# Patient Record
Sex: Male | Born: 1935
Health system: Southern US, Community
[De-identification: ages and names within clinical notes are randomized; demographics above are authoritative.]

## PROBLEM LIST (undated history)

## (undated) DIAGNOSIS — H409 Unspecified glaucoma: Secondary | ICD-10-CM

## (undated) DIAGNOSIS — E079 Disorder of thyroid, unspecified: Secondary | ICD-10-CM

## (undated) DIAGNOSIS — K219 Gastro-esophageal reflux disease without esophagitis: Secondary | ICD-10-CM

## (undated) HISTORY — PX: CATARACT EXTRACTION: SUR2

## (undated) HISTORY — DX: Unspecified glaucoma: H40.9

## (undated) HISTORY — DX: Gastro-esophageal reflux disease without esophagitis: K21.9

## (undated) HISTORY — DX: Disorder of thyroid, unspecified: E07.9

---

## 2002-10-06 ENCOUNTER — Encounter: Admission: RE | Admit: 2002-10-06 | Discharge: 2002-10-06 | Payer: Self-pay | Admitting: Family Medicine

## 2002-10-06 ENCOUNTER — Encounter: Payer: Self-pay | Admitting: Family Medicine

## 2004-06-29 ENCOUNTER — Encounter (INDEPENDENT_AMBULATORY_CARE_PROVIDER_SITE_OTHER): Payer: Self-pay | Admitting: Specialist

## 2004-06-29 ENCOUNTER — Ambulatory Visit (HOSPITAL_COMMUNITY): Admission: RE | Admit: 2004-06-29 | Discharge: 2004-06-29 | Payer: Self-pay | Admitting: *Deleted

## 2006-01-10 ENCOUNTER — Ambulatory Visit: Payer: Self-pay | Admitting: Family Medicine

## 2006-07-26 ENCOUNTER — Ambulatory Visit: Payer: Self-pay | Admitting: Family Medicine

## 2007-02-05 ENCOUNTER — Ambulatory Visit: Payer: Self-pay | Admitting: Family Medicine

## 2007-02-05 DIAGNOSIS — R1013 Epigastric pain: Secondary | ICD-10-CM | POA: Insufficient documentation

## 2007-02-06 ENCOUNTER — Encounter (INDEPENDENT_AMBULATORY_CARE_PROVIDER_SITE_OTHER): Payer: Self-pay | Admitting: Family Medicine

## 2007-02-06 ENCOUNTER — Encounter (INDEPENDENT_AMBULATORY_CARE_PROVIDER_SITE_OTHER): Payer: Self-pay | Admitting: *Deleted

## 2007-02-06 LAB — CONVERTED CEMR LAB
ALT: 27 units/L (ref 0–53)
Albumin: 4.3 g/dL (ref 3.5–5.2)
Alkaline Phosphatase: 53 units/L (ref 39–117)
Basophils Absolute: 0 10*3/uL (ref 0.0–0.1)
HCT: 50.7 % (ref 39.0–52.0)
Hemoglobin: 17.1 g/dL — ABNORMAL HIGH (ref 13.0–17.0)
MCHC: 33.7 g/dL (ref 30.0–36.0)
Monocytes Relative: 10.2 % (ref 3.0–11.0)
Neutro Abs: 4.5 10*3/uL (ref 1.4–7.7)
Total Bilirubin: 1.3 mg/dL — ABNORMAL HIGH (ref 0.3–1.2)
Total Protein: 7.5 g/dL (ref 6.0–8.3)
WBC: 7.1 10*3/uL (ref 4.5–10.5)

## 2007-02-17 DIAGNOSIS — K7689 Other specified diseases of liver: Secondary | ICD-10-CM | POA: Insufficient documentation

## 2007-10-20 ENCOUNTER — Telehealth (INDEPENDENT_AMBULATORY_CARE_PROVIDER_SITE_OTHER): Payer: Self-pay | Admitting: *Deleted

## 2008-01-19 ENCOUNTER — Telehealth (INDEPENDENT_AMBULATORY_CARE_PROVIDER_SITE_OTHER): Payer: Self-pay | Admitting: *Deleted

## 2017-09-02 ENCOUNTER — Encounter: Payer: Self-pay | Admitting: Family Medicine

## 2017-09-02 ENCOUNTER — Ambulatory Visit: Payer: Medicare Other | Admitting: Family Medicine

## 2017-09-02 VITALS — BP 128/80 | HR 74 | Ht 69.7 in | Wt 222.5 lb

## 2017-09-02 DIAGNOSIS — G4700 Insomnia, unspecified: Secondary | ICD-10-CM

## 2017-09-02 DIAGNOSIS — K219 Gastro-esophageal reflux disease without esophagitis: Secondary | ICD-10-CM | POA: Insufficient documentation

## 2017-09-02 DIAGNOSIS — N183 Chronic kidney disease, stage 3 unspecified: Secondary | ICD-10-CM | POA: Insufficient documentation

## 2017-09-02 DIAGNOSIS — E039 Hypothyroidism, unspecified: Secondary | ICD-10-CM

## 2017-09-02 LAB — CBC
HCT: 44.6 % (ref 39.0–52.0)
Hemoglobin: 15.3 g/dL (ref 13.0–17.0)
MCHC: 34.4 g/dL (ref 30.0–36.0)
MCV: 97.5 fl (ref 78.0–100.0)
PLATELETS: 159 10*3/uL (ref 150.0–400.0)
RBC: 4.57 Mil/uL (ref 4.22–5.81)
RDW: 13.2 % (ref 11.5–15.5)
WBC: 6.1 10*3/uL (ref 4.0–10.5)

## 2017-09-02 LAB — BASIC METABOLIC PANEL
BUN: 18 mg/dL (ref 6–23)
CALCIUM: 9.2 mg/dL (ref 8.4–10.5)
CO2: 27 mEq/L (ref 19–32)
CREATININE: 1.08 mg/dL (ref 0.40–1.50)
Chloride: 104 mEq/L (ref 96–112)
GFR: 68.57 mL/min (ref >60.00)
Glucose, Bld: 93 mg/dL (ref 70–99)
Potassium: 4.4 mEq/L (ref 3.5–5.1)
SODIUM: 140 meq/L (ref 135–145)

## 2017-09-02 LAB — TSH: TSH: 2.75 u[IU]/mL (ref 0.35–4.50)

## 2017-09-02 MED ORDER — MELATONIN 5 MG PO CAPS: ORAL_CAPSULE | ORAL | 0 refills | Status: DC

## 2017-09-02 MED ORDER — OMEPRAZOLE 20 MG PO CPDR: 20.0000 mg | DELAYED_RELEASE_CAPSULE | Freq: Every day | ORAL | 4 refills | Status: DC

## 2017-09-02 NOTE — Progress Notes (Addendum)
Subjective:  Patient ID: Todd Mckay, male    DOB: 1927-08-02  Age: 84 y.o. MRN: [id number]  CC: Establish Care   HPI Todd Mckay presents for establishment of care and follow-up of his hypothyroidism, GERD, CKD and insomnia.  Last TSH approximately 1 year ago was normal.  He is not having no problems taking his levothyroxine.  He is taking it in the morning 1 hour prior to eating.  He has been taking Prilosec as needed for reflux symptoms.  He takes it 4-5 times a week his symptoms are well controlled with that.  He has a history of CKD stage III.  It is been stable.  He has no history of hypertension or chronic kidney disease.  He has not had issues with anemia in the past.  He has difficulty falling asleep at times and is taken alprazolam in the distant past.  He has not taken this drug in quite some time.  He denies depression.  He has not tried melatonin but is open to trying that medicine.  He remains active around his house and plays golf.  He lives with his wife.  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  His wife and he lives independently.  They are able to perform all of their activities of daily living.  With his history of glaucoma he sees the eye doctor regularly.  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  History Todd Mckay has a past medical history of GERD (gastroesophageal reflux disease), Glaucoma, and Thyroid disease.   He has a past surgical history that includes Cataract extraction (Right) and Cataract extraction (Left).   His family history is not on file.He reports that he has quit smoking. He has never used smokeless tobacco. His alcohol and drug histories are not on file.  Outpatient Medications Prior to Visit  Medication Sig Dispense Refill  . donepezil (ARICEPT) 10 MG tablet Take 1 tablet by mouth daily.  3  . dorzolamide-timolol (COSOPT) 22.3-6.8 MG/ML ophthalmic solution  Instill 1 drop in both eyes twice daily.  2  . latanoprost (XALATAN) 0.005 % ophthalmic solution INT 1 GTT IN OD Q NIGHT  1  . pilocarpine (PILOCAR) 1 % ophthalmic solution INT 1 GTT IN OD TID  11  . ALPRAZolam (XANAX) 0.25 MG tablet Take 0.25 mg by mouth at bedtime as needed for anxiety.    Marland Kitchen levothyroxine (SYNTHROID, LEVOTHROID) 75 MCG tablet Take 1 tablet by mouth daily.  3  . omeprazole (PRILOSEC) 20 MG capsule Take 20 mg by mouth daily.     No facility-administered medications prior to visit.     ROS Review of Systems  Constitutional: Negative.   HENT: Negative.   Eyes: Negative.   Respiratory: Negative.   Cardiovascular: Negative.   Gastrointestinal: Negative.   Endocrine: Negative.  Negative for polyphagia and polyuria.  Genitourinary: Negative.  Negative for difficulty urinating, hematuria and urgency.  Musculoskeletal: Negative for gait problem and myalgias.  Skin: Negative.   Allergic/Immunologic: Negative for immunocompromised state.  Neurological: Negative for weakness and headaches.  Hematological: Does not bruise/bleed easily.  Psychiatric/Behavioral: Positive for self-injury. Negative for behavioral problems and dysphoric mood.    Objective:  BP 128/80   Pulse 74   Ht 5' 9.7" (1.77 m)   Wt 222 lb 8 oz (100.9 kg)   SpO2 99%   BMI 32.20 kg/m   Physical Exam  Constitutional: He  is oriented to person, place, and time. He appears well-developed and well-nourished. No distress.  HENT:  Head: Normocephalic and atraumatic.  Right Ear: External ear normal.  Left Ear: External ear normal.  Nose: Nose normal.  Mouth/Throat: Oropharynx is clear and moist. No oropharyngeal exudate.  Eyes: Pupils are equal, round, and reactive to light. Conjunctivae and EOM are normal. Right eye exhibits no discharge. Left eye exhibits no discharge.  Neck: Normal range of motion. Neck supple. No JVD present. No tracheal deviation present. No thyromegaly present.  Cardiovascular: Normal  rate, regular rhythm and normal heart sounds.  Pulmonary/Chest: Effort normal and breath sounds normal. No stridor. No respiratory distress. He has no wheezes. He has no rales.  Abdominal: Bowel sounds are normal.  Musculoskeletal: He exhibits no edema or deformity.  Lymphadenopathy:    He has no cervical adenopathy.  Neurological: He is alert and oriented to person, place, and time.  Skin: Skin is warm and dry. Capillary refill takes less than 2 seconds. No rash noted. He is not diaphoretic. No erythema. No pallor.  Psychiatric: He has a normal mood and affect. His behavior is normal.      Assessment & Plan:   Todd Mckay was seen today for establish care.  Diagnoses and all orders for this visit:  Hypothyroidism, unspecified type -     TSH -     levothyroxine (SYNTHROID, LEVOTHROID) 75 MCG tablet; Take 1 tablet (75 mcg total) by mouth daily.  Gastroesophageal reflux disease, esophagitis presence not specified -     omeprazole (PRILOSEC) 20 MG capsule; Take 1 capsule (20 mg total) by mouth daily.  CKD (chronic kidney disease) stage 3, GFR 30-59 ml/min (HCC) -     Basic metabolic panel -     CBC  Insomnia, unspecified type -     Melatonin 5 MG CAPS; Take one at night as needed for sleep.   I have discontinued Todd Mckay's ALPRAZolam. I have also changed his omeprazole and levothyroxine. Additionally, I am having him start on Melatonin. Lastly, I am having him maintain his donepezil, dorzolamide-timolol, latanoprost, and pilocarpine.  Meds ordered this encounter  Medications  . Melatonin 5 MG CAPS    Sig: Take one at night as needed for sleep.    Dispense:  90 each    Refill:  0  . omeprazole (PRILOSEC) 20 MG capsule    Sig: Take 1 capsule (20 mg total) by mouth daily.    Dispense:  90 capsule    Refill:  4  . levothyroxine (SYNTHROID, LEVOTHROID) 75 MCG tablet    Sig: Take 1 tablet (75 mcg total) by mouth daily.    Dispense:  100 tablet    Refill:  4   Sleep hygiene was  discussed.  Encouraged him to stay active.  He will give the melatonin to try and let me know how it works out for him.  He is doing well and will plan on following him up in 6 months he is to see me sooner with any problems at all.  Follow-up: Return in about 6 months (around 03/05/2018), or if symptoms worsen or fail to improve.  Mliss Sax, MD

## 2017-09-02 NOTE — Patient Instructions (Signed)

## 2017-09-03 MED ORDER — LEVOTHYROXINE SODIUM 75 MCG PO TABS: 75.0000 ug | ORAL_TABLET | Freq: Every day | ORAL | 4 refills | Status: DC

## 2017-09-03 NOTE — Addendum Note (Signed)
Addended by: Andrez Grime on: 09/03/2017 01:42 PM   Modules accepted: Orders

## 2017-09-11 ENCOUNTER — Ambulatory Visit: Payer: Medicare Other | Admitting: Family Medicine

## 2017-09-11 ENCOUNTER — Encounter: Payer: Self-pay | Admitting: Family Medicine

## 2017-09-11 ENCOUNTER — Ambulatory Visit (INDEPENDENT_AMBULATORY_CARE_PROVIDER_SITE_OTHER): Payer: Medicare Other

## 2017-09-11 VITALS — BP 128/72 | HR 84 | Temp 98.0°F | Ht 69.7 in | Wt 220.0 lb

## 2017-09-11 DIAGNOSIS — M1710 Unilateral primary osteoarthritis, unspecified knee: Secondary | ICD-10-CM | POA: Insufficient documentation

## 2017-09-11 DIAGNOSIS — M25561 Pain in right knee: Secondary | ICD-10-CM | POA: Diagnosis not present

## 2017-09-11 DIAGNOSIS — M179 Osteoarthritis of knee, unspecified: Secondary | ICD-10-CM | POA: Insufficient documentation

## 2017-09-11 NOTE — Patient Instructions (Signed)
Nice to meet you.   Please try to ice your knee.  Take tylenol 650 mg three times a day is the best evidence based medicine we have for arthritis.   Glucosamine sulfate  twice a day is a supplement that has been shown to help moderate to severe arthritis.  Vitamin D 2000 IU daily  Fish oil 2 grams daily.   Tumeric  twice daily.   Capsaicin topically up to four times a day may also help with pain.  Cortisone injections are an option if these interventions do not seem to make a difference or need more relief.   If cortisone injections do not help, there are different types of shots that may help but they take longer to take effect.  We can discuss this at follow up.   It's important that you continue to stay active.  Controlling your weight is important.   Please follow up with me in 4-6 weeks if there is no improvement.

## 2017-09-11 NOTE — Progress Notes (Signed)
Todd Mckay - 84 y.o. male MRN [id number]  Date of birth: November 10, 1927  SUBJECTIVE:  Including CC & ROS.  Chief Complaint  Patient presents with  . Knee Pain    Todd Mckay is a 84 y.o. male that is presenting with knee pain. Pain is bilateral, right worse than left. Ongoing for one month. Located in the medial aspect of his right knee. Pain is described as an ache. He has been taking Advil for the pain. Admits to swelling. Denies tenderness. Denies activity exacerbates the pain. He states the pain has interfered with him playing golf weekly.   Review of right knee x-ray from 02/29/2016 shows narrowing of the medial joint compartment.   Review of Systems  Constitutional: Negative for fever.  HENT: Negative for congestion.   Respiratory: Negative for cough.   Cardiovascular: Negative for chest pain.  Gastrointestinal: Negative for abdominal distention.  Musculoskeletal: Negative for gait problem.  Skin: Negative for color change.  Neurological: Negative for weakness.  Hematological: Negative for adenopathy.  Psychiatric/Behavioral: Negative for agitation.    HISTORY: Past Medical, Surgical, Social, and Family History Reviewed & Updated per EMR.   Pertinent Historical Findings include:  Past Medical History:  Diagnosis Date  . GERD (gastroesophageal reflux disease)   . Glaucoma   . Thyroid disease     Past Surgical History:  Procedure Laterality Date  . CATARACT EXTRACTION Right   . CATARACT EXTRACTION Left     Allergies  Allergen Reactions  . Erythromycin   . Erythromycin Base Hives    Derm. Hives  . Penicillins Hives    Derm, Hives     No family history on file.   Social History   Socioeconomic History  . Marital status: Married    Spouse name: Not on file  . Number of children: Not on file  . Years of education: Not on file  . Highest education level: Not on file  Occupational History  . Not on file  Social Needs  . Financial resource strain: Not on  file  . Food insecurity:    Worry: Not on file    Inability: Not on file  . Transportation needs:    Medical: Not on file    Non-medical: Not on file  Tobacco Use  . Smoking status: Former Games developer  . Smokeless tobacco: Never Used  Substance and Sexual Activity  . Alcohol use: Not on file  . Drug use: Not on file  . Sexual activity: Not on file  Lifestyle  . Physical activity:    Days per week: Not on file    Minutes per session: Not on file  . Stress: Not on file  Relationships  . Social connections:    Talks on phone: Not on file    Gets together: Not on file    Attends religious service: Not on file    Active member of club or organization: Not on file    Attends meetings of clubs or organizations: Not on file    Relationship status: Not on file  . Intimate partner violence:    Fear of current or ex partner: Not on file    Emotionally abused: Not on file    Physically abused: Not on file    Forced sexual activity: Not on file  Other Topics Concern  . Not on file  Social History Narrative  . Not on file     PHYSICAL EXAM:  VS: BP 128/72 (BP Location: Left Arm, Patient Position: Sitting, Cuff Size:  Normal)   Pulse 84   Temp 98 F (36.7 C) (Oral)   Ht 5' 9.7" (1.77 m)   Wt 220 lb (99.8 kg)   SpO2 98%   BMI 31.84 kg/m  Physical Exam Gen: NAD, alert, cooperative with exam, well-appearing ENT: normal lips, normal nasal mucosa,  Eye: normal EOM, normal conjunctiva and lids CV:  no edema, +2 pedal pulses   Resp: no accessory muscle use, non-labored,   Skin: no rashes, no areas of induration  Neuro: normal tone, normal sensation to touch Psych:  normal insight, alert and oriented MSK:  Right Knee: Normal to inspection with no erythema or effusion or obvious bony abnormalities. Palpation normal with no warmth, joint line tenderness, patellar tenderness, or condyle tenderness. ROM full in flexion and extension and lower leg rotation. Ligaments with solid  consistent endpoints including  LCL, MCL. Negative Mcmurray's tests. Non painful patellar compression. Patellar glide without crepitus. Patellar and quadriceps tendons unremarkable. Hamstring and quadriceps strength is normal.  Neurovascularly intact   Limited ultrasound: Right knee:  No significant effusion in the suprapatellar pouch. Normal-appearing quadricep and patellar tendon. Normal-appearing lateral meniscus. Significant narrowing of the medial joint line with outpouching of the medial meniscus  Summary: Significant degenerative changes of the medial joint line  Ultrasound and interpretation by Clare Gandy, MD    Aspiration/Injection Procedure Note Aaren Atallah [birth year]  Procedure: Injection Indications: Right knee pain  Procedure Details Consent: Risks of procedure as well as the alternatives and risks of each were explained to the (patient/caregiver).  Consent for procedure obtained. Time Out: Verified patient identification, verified procedure, site/side was marked, verified correct patient position, special equipment/implants available, medications/allergies/relevent history reviewed, required imaging and test results available.  Performed.  The area was cleaned with iodine and alcohol swabs.    The right knee superior lateral suprapatellar pouch was injected using 1 cc's of 40 mg/mL Kenalog and 4 cc's of 0.25% bupivacaine with a 22 1 1/2" needle.  Ultrasound was used. Images were obtained in  Long views showing the injection.    A sterile dressing was applied.  Patient did tolerate procedure well.   ASSESSMENT & PLAN:   Acute pain of right knee Acute on chronic in nature.  Likely the result of arthritic change.  No mechanical symptoms today. -Injection today -Counseled on supportive care -Counseled on home exercise therapy -X-ray -If no improvement can consider physical therapy or Visco supplementation.

## 2017-09-11 NOTE — Assessment & Plan Note (Signed)
Acute on chronic in nature.  Likely the result of arthritic change.  No mechanical symptoms today. -Injection today -Counseled on supportive care -Counseled on home exercise therapy -X-ray -If no improvement can consider physical therapy or Visco supplementation.

## 2017-12-23 ENCOUNTER — Encounter: Payer: Self-pay | Admitting: Family Medicine

## 2017-12-23 ENCOUNTER — Ambulatory Visit: Payer: Medicare Other | Admitting: Family Medicine

## 2017-12-23 ENCOUNTER — Ambulatory Visit (INDEPENDENT_AMBULATORY_CARE_PROVIDER_SITE_OTHER): Payer: Medicare Other

## 2017-12-23 VITALS — BP 128/62 | HR 58 | Ht 69.7 in | Wt 226.0 lb

## 2017-12-23 DIAGNOSIS — M1711 Unilateral primary osteoarthritis, right knee: Secondary | ICD-10-CM

## 2017-12-23 NOTE — Progress Notes (Signed)
Todd Mckay - 84 y.o. male MRN [id number]  Date of birth: [birth year]  SUBJECTIVE:  Including CC & ROS. He has Chief Complaint  Patient presents with  . Follow-up    Todd Mckay is a 84 y.o. male that is here today for right knee follow up. He was seen on 09/11/17 was given an steroid injection which improved his pain. He states his knee stills ache-worse when he is sitting. Pain is located at the lateral portion. He has been taking tylenol for the pain. Denies injury.  The steroid injection improve the pain for roughly 2 to 3 weeks.  He has never had gel injections previously.  The pain feels similar to what has previously.  Denies any injury or inciting event.  Pain is occurring on the medial aspect of the right knee.  Independent review of the right knee x-ray from 5/15 shows degenerative joint disease the medial component of moderate severity.   Review of Systems  Constitutional: Negative for fever.  HENT: Negative for congestion.   Respiratory: Negative for cough.   Cardiovascular: Negative for chest pain.  Gastrointestinal: Negative for abdominal pain.  Musculoskeletal: Positive for arthralgias.  Skin: Negative for color change.  Neurological: Negative for weakness.  Hematological: Negative for adenopathy.  Psychiatric/Behavioral: Negative for agitation.    HISTORY: Past Medical, Surgical, Social, and Family History Reviewed & Updated per EMR.   Pertinent Historical Findings include:  Past Medical History:  Diagnosis Date  . GERD (gastroesophageal reflux disease)   . Glaucoma   . Thyroid disease     Past Surgical History:  Procedure Laterality Date  . CATARACT EXTRACTION Right   . CATARACT EXTRACTION Left     Allergies  Allergen Reactions  . Acyclovir And Related   . Erythromycin   . Erythromycin Base Hives    Derm. Hives  . Penicillins Hives    Derm, Hives     No family history on file.   Social History   Socioeconomic History  . Marital status: Married     Spouse name: Not on file  . Number of children: Not on file  . Years of education: Not on file  . Highest education level: Not on file  Occupational History  . Not on file  Social Needs  . Financial resource strain: Not on file  . Food insecurity:    Worry: Not on file    Inability: Not on file  . Transportation needs:    Medical: Not on file    Non-medical: Not on file  Tobacco Use  . Smoking status: Former Games developer  . Smokeless tobacco: Never Used  Substance and Sexual Activity  . Alcohol use: Not on file  . Drug use: Not on file  . Sexual activity: Not on file  Lifestyle  . Physical activity:    Days per week: Not on file    Minutes per session: Not on file  . Stress: Not on file  Relationships  . Social connections:    Talks on phone: Not on file    Gets together: Not on file    Attends religious service: Not on file    Active member of club or organization: Not on file    Attends meetings of clubs or organizations: Not on file    Relationship status: Not on file  . Intimate partner violence:    Fear of current or ex partner: Not on file    Emotionally abused: Not on file    Physically abused: Not on  file    Forced sexual activity: Not on file  Other Topics Concern  . Not on file  Social History Narrative  . Not on file     PHYSICAL EXAM:  VS: BP 128/62 (BP Location: Left Arm, Patient Position: Sitting, Cuff Size: Normal)   Pulse (!) 58   Ht 5' 9.7" (1.77 m)   Wt 226 lb (102.5 kg)   SpO2 96%   BMI 32.71 kg/m  Physical Exam Gen: NAD, alert, cooperative with exam, well-appearing ENT: normal lips, normal nasal mucosa,  Eye: normal EOM, normal conjunctiva and lids CV:  no edema, +2 pedal pulses   Resp: no accessory muscle use, non-labored,  Skin: no rashes, no areas of induration  Neuro: normal tone, normal sensation to touch Psych:  normal insight, alert and oriented MSK:  Right knee:  No obvious effusion. Tenderness palpation of the medial joint  line. Normal strength to resistance with knee flexion and extension. Normal range of motion. Mild instability with valgus testing. Negative McMurray's test. No pain with patellar grind or compression. Neurovascular intact   Aspiration/Injection Procedure Note Juliane PootBill Abdou 11/25/1927  Procedure: Injection Indications: Right knee pain  Procedure Details Consent: Risks of procedure as well as the alternatives and risks of each were explained to the (patient/caregiver).  Consent for procedure obtained. Time Out: Verified patient identification, verified procedure, site/side was marked, verified correct patient position, special equipment/implants available, medications/allergies/relevent history reviewed, required imaging and test results available.  Performed.  The area was cleaned with iodine and alcohol swabs.    The right knee superior lateral suprapatellar pouch was injected using 1 cc's of 40 mg Kenalog and 4 cc's of 0.25% bupivacaine with a 22 1 1/2" needle.  Ultrasound was used. Images were obtained in Long views showing the injection.    A sterile dressing was applied.  Patient did tolerate procedure well.       ASSESSMENT & PLAN:   OA (osteoarthritis) of knee Pain likely OA in nature. Had limited improvement for only 2-3 weeks with prior steroid injection  - injection today  - pennsaid samples provided  - will try gel injections.

## 2017-12-23 NOTE — Patient Instructions (Signed)
Good to see you  Please try ice and compression  Take tylenol 650 mg three times a day is the best evidence based medicine we have for arthritis.  Glucosamine sulfate 750mg  twice a day is a supplement that has been shown to help moderate to severe arthritis. Vitamin D 2000 IU daily Fish oil 2 grams daily.  Tumeric 500mg  twice daily.  Capsaicin topically up to four times a day may also help with pain. We will call you about the gel injections.

## 2017-12-24 ENCOUNTER — Ambulatory Visit: Payer: Medicare Other | Admitting: Family Medicine

## 2017-12-24 ENCOUNTER — Encounter: Payer: Self-pay | Admitting: Family Medicine

## 2017-12-24 ENCOUNTER — Other Ambulatory Visit: Payer: Self-pay | Admitting: Family Medicine

## 2017-12-24 VITALS — BP 130/80 | HR 105 | Ht 69.7 in | Wt 226.0 lb

## 2017-12-24 DIAGNOSIS — R5383 Other fatigue: Secondary | ICD-10-CM | POA: Diagnosis not present

## 2017-12-24 DIAGNOSIS — G4700 Insomnia, unspecified: Secondary | ICD-10-CM | POA: Insufficient documentation

## 2017-12-24 LAB — URINALYSIS, ROUTINE W REFLEX MICROSCOPIC
Bilirubin Urine: NEGATIVE
Hgb urine dipstick: NEGATIVE
KETONES UR: NEGATIVE
Leukocytes, UA: NEGATIVE
NITRITE: NEGATIVE
PH: 5.5 (ref 5.0–8.0)
RBC / HPF: NONE SEEN (ref 0–?)
SPECIFIC GRAVITY, URINE: 1.015 (ref 1.000–1.030)
Total Protein, Urine: NEGATIVE
Urine Glucose: NEGATIVE
Urobilinogen, UA: 0.2 (ref 0.0–1.0)

## 2017-12-24 MED ORDER — TRAZODONE HCL 50 MG PO TABS: 25.0000 mg | ORAL_TABLET | Freq: Every evening | ORAL | 1 refills | Status: DC | PRN

## 2017-12-24 NOTE — Progress Notes (Signed)
Subjective:  Patient ID: Todd Mckay, male    DOB: [birth year]  Age: 84 y.o. MRN: [id number]  CC: Fatigue and low energy   HPI Todd Mckay presents for follow-up of insomnia.  Tried melatonin and it did not help much.  Patient reports more difficulty with staying asleep and falling asleep.  They do have a computer in the TV in the bedroom but did not use it at nighttime.  Patient rarely snores and no apnea has been observed.  He does have a glass of wine with lunch and dinner and then 1 vodka martini in the late afternoon.  Lately his he has been fatigued.  There is been no weight loss.  Recent blood work is been normal.  He is not exercising.  Outpatient Medications Prior to Visit  Medication Sig Dispense Refill  . donepezil (ARICEPT) 10 MG tablet Take 1 tablet by mouth daily.  3  . dorzolamide-timolol (COSOPT) 22.3-6.8 MG/ML ophthalmic solution Instill 1 drop in both eyes twice daily.  2  . latanoprost (XALATAN) 0.005 % ophthalmic solution INT 1 GTT IN OD Q NIGHT  1  . levothyroxine (SYNTHROID, LEVOTHROID) 75 MCG tablet Take 1 tablet (75 mcg total) by mouth daily. 100 tablet 4  . omeprazole (PRILOSEC) 20 MG capsule Take 1 capsule (20 mg total) by mouth daily. 90 capsule 4  . pilocarpine (PILOCAR) 1 % ophthalmic solution INT 1 GTT IN OD TID  11   No facility-administered medications prior to visit.     ROS Review of Systems  Constitutional: Negative for chills, fatigue and fever.  HENT: Negative.   Eyes: Negative.   Respiratory: Negative.   Cardiovascular: Negative.   Gastrointestinal: Negative.   Endocrine: Negative for polyphagia and polyuria.  Genitourinary: Negative for decreased urine volume and difficulty urinating.  Musculoskeletal: Positive for arthralgias.  Skin: Negative for pallor.  Allergic/Immunologic: Negative for immunocompromised state.  Hematological: Does not bruise/bleed easily.  Psychiatric/Behavioral: Positive for sleep disturbance. Negative for self-injury  and suicidal ideas.    Objective:  BP 130/80   Pulse (!) 105   Ht 5' 9.7" (1.77 m)   Wt 226 lb (102.5 kg)   SpO2 100%   BMI 32.71 kg/m   BP Readings from Last 3 Encounters:  12/24/17 130/80  12/23/17 128/62  09/11/17 128/72    Wt Readings from Last 3 Encounters:  12/24/17 226 lb (102.5 kg)  12/23/17 226 lb (102.5 kg)  09/11/17 220 lb (99.8 kg)    Physical Exam  Constitutional: He is oriented to person, place, and time. He appears well-developed and well-nourished. No distress.  HENT:  Head: Normocephalic and atraumatic.  Right Ear: External ear normal.  Left Ear: External ear normal.  Eyes: Right eye exhibits no discharge. Left eye exhibits no discharge. No scleral icterus.  Neck: No JVD present. No tracheal deviation present.  Pulmonary/Chest: Effort normal.  Neurological: He is alert and oriented to person, place, and time.  Skin: He is not diaphoretic.  Psychiatric: He has a normal mood and affect. His behavior is normal.   Depression screen [facility] 2/9 12/24/2017  Decreased Interest 1  Down, Depressed, Hopeless 0  PHQ - 2 Score 1  Altered sleeping 2  Tired, decreased energy 2  Change in appetite 1  Feeling bad or failure about yourself  0  Trouble concentrating 0  Moving slowly or fidgety/restless 0  Suicidal thoughts 0  PHQ-9 Score 6     Lab Results  Component Value Date   WBC 6.1 09/02/2017  HGB 15.3 09/02/2017   HCT 44.6 09/02/2017   PLT 159.0 09/02/2017   GLUCOSE 93 09/02/2017   ALT 27 02/05/2007   AST 37 02/05/2007   NA 140 09/02/2017   K 4.4 09/02/2017   CL 104 09/02/2017   CREATININE 1.08 09/02/2017   BUN 18 09/02/2017   CO2 27 09/02/2017   TSH 2.75 09/02/2017    Converted [facility] Imaging  Result Date: 06/01/2010 Document Description: Imaging Report Order Summary: Imaging Report//abdominal ultasound Imaging Report//abdominal ultasound Imported By: Job Foundsyris Arenas 02/17/2007 09:18:51  _____________________________________________________________________ External Attachment:   Type:   Image    Comment:   External Document Document Description: Append Order Summary: Orders Update Advise patient he has no definite stone in gallbladder, but has sludge in gallbladder which can possibility cause his epigastric pain. Will refer to General surgeon. Clinical Lists Changes Problems: Added new problem of FATTY LIVER DISEASE (ICD-571.8) - With normal LFT Orders: Added new Referral order of Surgical Referral (Surgery) - Signed    Assessment & Plan:   Annette StableBill was seen today for fatigue and low energy.  Diagnoses and all orders for this visit:  Insomnia, unspecified type -     traZODone (DESYREL) 50 MG tablet; Take 0.5-1 tablets (25-50 mg total) by mouth at bedtime as needed for sleep.  Fatigue, unspecified type -     Urinalysis, Routine w reflex microscopic   I am having Todd Mckay start on traZODone. I am also having him maintain his donepezil, dorzolamide-timolol, latanoprost, pilocarpine, omeprazole, and levothyroxine.  Meds ordered this encounter  Medications  . traZODone (DESYREL) 50 MG tablet    Sig: Take 0.5-1 tablets (25-50 mg total) by mouth at bedtime as needed for sleep.    Dispense:  30 tablet    Refill:  1   Encouraged patient to exercise.  Suggested walking.  Will try trazodone.  Suggested that alcohol may be a factor.  Anticipatory guidance was given to patient on sleep and fatigue.  Follow-up: Return in about 1 month (around 01/24/2018).  Mliss SaxWilliam Alfred Kremer, MD

## 2017-12-24 NOTE — Assessment & Plan Note (Signed)
Pain likely OA in nature. Had limited improvement for only 2-3 weeks with prior steroid injection  - injection today  - pennsaid samples provided  - will try gel injections.

## 2017-12-24 NOTE — Patient Instructions (Signed)
Insomnia Insomnia is a sleep disorder that makes it difficult to fall asleep or to stay asleep. Insomnia can cause tiredness (fatigue), low energy, difficulty concentrating, mood swings, and poor performance at work or school. There are three different ways to classify insomnia:  Difficulty falling asleep.  Difficulty staying asleep.  Waking up too early in the morning.  Any type of insomnia can be long-term (chronic) or short-term (acute). Both are common. Short-term insomnia usually lasts for three months or less. Chronic insomnia occurs at least three times a week for longer than three months. What are the causes? Insomnia may be caused by another condition, situation, or substance, such as:  Anxiety.  Certain medicines.  Gastroesophageal reflux disease (GERD) or other gastrointestinal conditions.  Asthma or other breathing conditions.  Restless legs syndrome, sleep apnea, or other sleep disorders.  Chronic pain.  Menopause. This may include hot flashes.  Stroke.  Abuse of alcohol, tobacco, or illegal drugs.  Depression.  Caffeine.  Neurological disorders, such as Alzheimer disease.  An overactive thyroid (hyperthyroidism).  The cause of insomnia may not be known. What increases the risk? Risk factors for insomnia include:  Gender. Women are more commonly affected than men.  Age. Insomnia is more common as you get older.  Stress. This may involve your professional or personal life.  Income. Insomnia is more common in people with lower income.  Lack of exercise.  Irregular work schedule or night shifts.  Traveling between different time zones.  What are the signs or symptoms? If you have insomnia, trouble falling asleep or trouble staying asleep is the main symptom. This may lead to other symptoms, such as:  Feeling fatigued.  Feeling nervous about going to sleep.  Not feeling rested in the morning.  Having trouble concentrating.  Feeling  irritable, anxious, or depressed.  How is this treated? Treatment for insomnia depends on the cause. If your insomnia is caused by an underlying condition, treatment will focus on addressing the condition. Treatment may also include:  Medicines to help you sleep.  Counseling or therapy.  Lifestyle adjustments.  Follow these instructions at home:  Take medicines only as directed by your health care provider.  Keep regular sleeping and waking hours. Avoid naps.  Keep a sleep diary to help you and your health care provider figure out what could be causing your insomnia. Include: ? When you sleep. ? When you wake up during the night. ? How well you sleep. ? How rested you feel the next day. ? Any side effects of medicines you are taking. ? What you eat and drink.  Make your bedroom a comfortable place where it is easy to fall asleep: ? Put up shades or special blackout curtains to block light from outside. ? Use a white noise machine to block noise. ? Keep the temperature cool.  Exercise regularly as directed by your health care provider. Avoid exercising right before bedtime.  Use relaxation techniques to manage stress. Ask your health care provider to suggest some techniques that may work well for you. These may include: ? Breathing exercises. ? Routines to release muscle tension. ? Visualizing peaceful scenes.  Cut back on alcohol, caffeinated beverages, and cigarettes, especially close to bedtime. These can disrupt your sleep.  Do not overeat or eat spicy foods right before bedtime. This can lead to digestive discomfort that can make it hard for you to sleep.  Limit screen use before bedtime. This includes: ? Watching TV. ? Using your smartphone, tablet, and   computer.  Stick to a routine. This can help you fall asleep faster. Try to do a quiet activity, brush your teeth, and go to bed at the same time each night.  Get out of bed if you are still awake after 15 minutes  of trying to sleep. Keep the lights down, but try reading or doing a quiet activity. When you feel sleepy, go back to bed.  Make sure that you drive carefully. Avoid driving if you feel very sleepy.  Keep all follow-up appointments as directed by your health care provider. This is important. Contact a health care provider if:  You are tired throughout the day or have trouble in your daily routine due to sleepiness.  You continue to have sleep problems or your sleep problems get worse. Get help right away if:  You have serious thoughts about hurting yourself or someone else. This information is not intended to replace advice given to you by your health care provider. Make sure you discuss any questions you have with your health care provider. Document Released: 04/13/2000 Document Revised: 09/16/2015 Document Reviewed: 01/15/2014 Elsevier Interactive Patient Education  2018 [employer] Fatigue Fatigue is feeling tired all of the time, a lack of energy, or a lack of motivation. Occasional or mild fatigue is often a normal response to activity or life in general. However, long-lasting (chronic) or extreme fatigue may indicate an underlying medical condition. Follow these instructions at home: Watch your fatigue for any changes. The following actions may help to lessen any discomfort you are feeling:  Talk to your health care provider about how much sleep you need each night. Try to get the required amount every night.  Take medicines only as directed by your health care provider.  Eat a healthy and nutritious diet. Ask your health care provider if you need help changing your diet.  Drink enough fluid to keep your urine clear or pale yellow.  Practice ways of relaxing, such as yoga, meditation, massage therapy, or acupuncture.  Exercise regularly.  Change situations that cause you stress. Try to keep your work and personal routine reasonable.  Do not abuse illegal drugs.  Limit  alcohol intake to no more than 1 drink per day for nonpregnant women and 2 drinks per day for men. One drink equals 12 ounces of beer, 5 ounces of wine, or 1 ounces of hard liquor.  Take a multivitamin, if directed by your health care provider.  Contact a health care provider if:  Your fatigue does not get better.  You have a fever.  You have unintentional weight loss or gain.  You have headaches.  You have difficulty: ? Falling asleep. ? Sleeping throughout the night.  You feel angry, guilty, anxious, or sad.  You are unable to have a bowel movement (constipation).  You skin is dry.  Your legs or another part of your body is swollen. Get help right away if:  You feel confused.  Your vision is blurry.  You feel faint or pass out.  You have a severe headache.  You have severe abdominal, pelvic, or back pain.  You have chest pain, shortness of breath, or an irregular or fast heartbeat.  You are unable to urinate or you urinate less than normal.  You develop abnormal bleeding, such as bleeding from the rectum, vagina, nose, lungs, or nipples.  You vomit blood.  You have thoughts about harming yourself or committing suicide.  You are worried that you might harm someone else. This information   is not intended to replace advice given to you by your health care provider. Make sure you discuss any questions you have with your health care provider. Document Released: 02/11/2007 Document Revised: 09/22/2015 Document Reviewed: 08/18/2013 Elsevier Interactive Patient Education  2018 Elsevier Inc.  

## 2018-02-04 ENCOUNTER — Encounter: Payer: Self-pay | Admitting: Family Medicine

## 2018-02-04 ENCOUNTER — Ambulatory Visit: Payer: Medicare Other | Admitting: Family Medicine

## 2018-02-04 ENCOUNTER — Ambulatory Visit: Payer: Self-pay

## 2018-02-04 VITALS — BP 118/76 | HR 79 | Wt 214.0 lb

## 2018-02-04 DIAGNOSIS — M1711 Unilateral primary osteoarthritis, right knee: Secondary | ICD-10-CM | POA: Diagnosis not present

## 2018-02-04 NOTE — Progress Notes (Signed)
Todd Mckay - 84 y.o. male MRN [id number]  Date of birth: Jan 02, 1928  SUBJECTIVE:  Including CC & ROS.  Chief Complaint  Patient presents with  . Knee Pain    Todd Mckay is a 84 y.o. male that is here today for right knee Orthovisc injection.  He continues to have knee pain that is localized to the knee itself.  Occurring in the medial joint space.  Having some swelling intermittently.  Pain is acute on chronic in nature.  Denies any locking or giving way.  He was recently diagnosed with A-Fib.   Independent review of the right knee x-ray from 5/15 shows significant medial sided degenerative changes.  Review of Systems  Constitutional: Negative for fever.  HENT: Negative for congestion.   Respiratory: Negative for cough.   Cardiovascular: Negative for chest pain.  Gastrointestinal: Negative for abdominal pain.  Musculoskeletal: Positive for arthralgias and joint swelling.  Skin: Negative for color change.  Neurological: Negative for weakness.  Hematological: Negative for adenopathy.  Psychiatric/Behavioral: Negative for agitation.    HISTORY: Past Medical, Surgical, Social, and Family History Reviewed & Updated per EMR.   Pertinent Historical Findings include:  Past Medical History:  Diagnosis Date  . GERD (gastroesophageal reflux disease)   . Glaucoma   . Thyroid disease     Past Surgical History:  Procedure Laterality Date  . CATARACT EXTRACTION Right   . CATARACT EXTRACTION Left     Allergies  Allergen Reactions  . Acyclovir And Related   . Erythromycin   . Erythromycin Base Hives    Derm. Hives  . Penicillins Hives    Derm, Hives     No family history on file.   Social History   Socioeconomic History  . Marital status: Married    Spouse name: Not on file  . Number of children: Not on file  . Years of education: Not on file  . Highest education level: Not on file  Occupational History  . Not on file  Social Needs  . Financial resource strain:  Not on file  . Food insecurity:    Worry: Not on file    Inability: Not on file  . Transportation needs:    Medical: Not on file    Non-medical: Not on file  Tobacco Use  . Smoking status: Former Games developer  . Smokeless tobacco: Never Used  Substance and Sexual Activity  . Alcohol use: Yes    Alcohol/week: 19.0 standard drinks    Types: 12 Glasses of wine, 7 Shots of liquor per week    Comment: One glass of wine with lunch and dinner. Vodka martina in the late afternoon.  . Drug use: Not on file  . Sexual activity: Not on file  Lifestyle  . Physical activity:    Days per week: Not on file    Minutes per session: Not on file  . Stress: Not on file  Relationships  . Social connections:    Talks on phone: Not on file    Gets together: Not on file    Attends religious service: Not on file    Active member of club or organization: Not on file    Attends meetings of clubs or organizations: Not on file    Relationship status: Not on file  . Intimate partner violence:    Fear of current or ex partner: Not on file    Emotionally abused: Not on file    Physically abused: Not on file    Forced sexual  activity: Not on file  Other Topics Concern  . Not on file  Social History Narrative  . Not on file     PHYSICAL EXAM:  VS: BP 118/76   Pulse 79   Wt 214 lb (97.1 kg)   SpO2 98%   BMI 30.97 kg/m  Physical Exam Gen: NAD, alert, cooperative with exam, well-appearing ENT: normal lips, normal nasal mucosa,  Eye: normal EOM, normal conjunctiva and lids CV:  no edema, +2 pedal pulses   Resp: no accessory muscle use, non-labored,   Skin: no rashes, no areas of induration  Neuro: normal tone, normal sensation to touch Psych:  normal insight, alert and oriented MSK:  Right knee:  Mild effusion  TTP of the medial joint line  Normal ROM  Normal strength  Neurovascularly intact    Aspiration/Injection Procedure Note Todd Mckay [birth year]  Procedure: Injection Indications:  right knee pain   Procedure Details Consent: Risks of procedure as well as the alternatives and risks of each were explained to the (patient/caregiver).  Consent for procedure obtained. Time Out: Verified patient identification, verified procedure, site/side was marked, verified correct patient position, special equipment/implants available, medications/allergies/relevent history reviewed, required imaging and test results available.  Performed.  The area was cleaned with iodine and alcohol swabs.    The the right knee superior lateral suprapatellar pouch was injected using  3 cc's of 1% lidocaine with a 22 1 1/2" needle.  The syringe was switched and a mixture of of Orthovisc was injected.  Ultrasound was used. Images were obtained in  Long views showing the injection.    A sterile dressing was applied.  Patient did tolerate procedure well.      ASSESSMENT & PLAN:   OA (osteoarthritis) of knee Started his series of orthovisc today.  - f/u in one week.

## 2018-02-05 NOTE — Assessment & Plan Note (Signed)
Started his series of orthovisc today.  - f/u in one week.

## 2018-02-11 ENCOUNTER — Ambulatory Visit: Payer: Self-pay

## 2018-02-11 ENCOUNTER — Encounter: Payer: Self-pay | Admitting: Family Medicine

## 2018-02-11 ENCOUNTER — Ambulatory Visit: Payer: Medicare Other | Admitting: Family Medicine

## 2018-02-11 DIAGNOSIS — M1711 Unilateral primary osteoarthritis, right knee: Secondary | ICD-10-CM

## 2018-02-11 NOTE — Assessment & Plan Note (Signed)
Completed second series of Orthovisc injections today. -Follow-up in 1 week to complete the series.

## 2018-02-11 NOTE — Progress Notes (Signed)
Todd Mckay - 84 y.o. male MRN [id number]  Date of birth: Sep 06, 1927  SUBJECTIVE:  Including CC & ROS.  Chief Complaint  Patient presents with  . Injections    Todd Mckay is a 84 y.o. male that is here today for his second Orthovisc injection.     Review of Systems  HISTORY: Past Medical, Surgical, Social, and Family History Reviewed & Updated per EMR.   Pertinent Historical Findings include:  Past Medical History:  Diagnosis Date  . GERD (gastroesophageal reflux disease)   . Glaucoma   . Thyroid disease     Past Surgical History:  Procedure Laterality Date  . CATARACT EXTRACTION Right   . CATARACT EXTRACTION Left     Allergies  Allergen Reactions  . Acyclovir And Related   . Erythromycin   . Erythromycin Base Hives    Derm. Hives  . Penicillins Hives    Derm, Hives     No family history on file.   Social History   Socioeconomic History  . Marital status: Married    Spouse name: Not on file  . Number of children: Not on file  . Years of education: Not on file  . Highest education level: Not on file  Occupational History  . Not on file  Social Needs  . Financial resource strain: Not on file  . Food insecurity:    Worry: Not on file    Inability: Not on file  . Transportation needs:    Medical: Not on file    Non-medical: Not on file  Tobacco Use  . Smoking status: Former Games developer  . Smokeless tobacco: Never Used  Substance and Sexual Activity  . Alcohol use: Yes    Alcohol/week: 19.0 standard drinks    Types: 12 Glasses of wine, 7 Shots of liquor per week    Comment: One glass of wine with lunch and dinner. Vodka martina in the late afternoon.  . Drug use: Not on file  . Sexual activity: Not on file  Lifestyle  . Physical activity:    Days per week: Not on file    Minutes per session: Not on file  . Stress: Not on file  Relationships  . Social connections:    Talks on phone: Not on file    Gets together: Not on file    Attends religious  service: Not on file    Active member of club or organization: Not on file    Attends meetings of clubs or organizations: Not on file    Relationship status: Not on file  . Intimate partner violence:    Fear of current or ex partner: Not on file    Emotionally abused: Not on file    Physically abused: Not on file    Forced sexual activity: Not on file  Other Topics Concern  . Not on file  Social History Narrative  . Not on file     PHYSICAL EXAM:  VS: There were no vitals taken for this visit. Physical Exam Gen: NAD, alert, cooperative with exam, well-appearing   Aspiration/Injection Procedure Note Todd Mckay [birth year]  Procedure: Injection Indications: right knee pain   Procedure Details Consent: Risks of procedure as well as the alternatives and risks of each were explained to the (patient/caregiver).  Consent for procedure obtained. Time Out: Verified patient identification, verified procedure, site/side was marked, verified correct patient position, special equipment/implants available, medications/allergies/relevent history reviewed, required imaging and test results available.  Performed.  The area was cleaned with  iodine and alcohol swabs.    The right knee superior lateral suprapatellar pouch was injected using 3 cc's of 1% lidocaine with a 22 1 1/2" needle to anesthetize the skin in the tract.  The syringe was switched and a mixture of Orthovisc was injected.  Ultrasound was used. Images were obtained in Long views showing the injection.    A sterile dressing was applied.  Patient did tolerate procedure well.       ASSESSMENT & PLAN:   OA (osteoarthritis) of knee Completed second series of Orthovisc injections today. -Follow-up in 1 week to complete the series.

## 2018-02-18 ENCOUNTER — Ambulatory Visit (INDEPENDENT_AMBULATORY_CARE_PROVIDER_SITE_OTHER): Payer: Medicare Other

## 2018-02-18 ENCOUNTER — Ambulatory Visit: Payer: Medicare Other | Admitting: Family Medicine

## 2018-02-18 DIAGNOSIS — M1711 Unilateral primary osteoarthritis, right knee: Secondary | ICD-10-CM | POA: Diagnosis not present

## 2018-02-18 NOTE — Progress Notes (Signed)
Todd Mckay - 84 y.o. male MRN [id number]  Date of birth: 08-22-1927  SUBJECTIVE:  Including CC & ROS.  No chief complaint on file.   Todd Mckay is a 84 y.o. male that is  Presenting with right knee pain and to complete his 3rd orthovisc injection.    Review of Systems  HISTORY: Past Medical, Surgical, Social, and Family History Reviewed & Updated per EMR.   Pertinent Historical Findings include:  Past Medical History:  Diagnosis Date  . GERD (gastroesophageal reflux disease)   . Glaucoma   . Thyroid disease     Past Surgical History:  Procedure Laterality Date  . CATARACT EXTRACTION Right   . CATARACT EXTRACTION Left     Allergies  Allergen Reactions  . Acyclovir And Related   . Erythromycin   . Erythromycin Base Hives    Derm. Hives  . Penicillins Hives    Derm, Hives     No family history on file.   Social History   Socioeconomic History  . Marital status: Married    Spouse name: Not on file  . Number of children: Not on file  . Years of education: Not on file  . Highest education level: Not on file  Occupational History  . Not on file  Social Needs  . Financial resource strain: Not on file  . Food insecurity:    Worry: Not on file    Inability: Not on file  . Transportation needs:    Medical: Not on file    Non-medical: Not on file  Tobacco Use  . Smoking status: Former Games developer  . Smokeless tobacco: Never Used  Substance and Sexual Activity  . Alcohol use: Yes    Alcohol/week: 19.0 standard drinks    Types: 12 Glasses of wine, 7 Shots of liquor per week    Comment: One glass of wine with lunch and dinner. Vodka martina in the late afternoon.  . Drug use: Not on file  . Sexual activity: Not on file  Lifestyle  . Physical activity:    Days per week: Not on file    Minutes per session: Not on file  . Stress: Not on file  Relationships  . Social connections:    Talks on phone: Not on file    Gets together: Not on file    Attends religious  service: Not on file    Active member of club or organization: Not on file    Attends meetings of clubs or organizations: Not on file    Relationship status: Not on file  . Intimate partner violence:    Fear of current or ex partner: Not on file    Emotionally abused: Not on file    Physically abused: Not on file    Forced sexual activity: Not on file  Other Topics Concern  . Not on file  Social History Narrative  . Not on file     PHYSICAL EXAM:  VS: There were no vitals taken for this visit. Physical Exam Gen: NAD, alert, cooperative with exam, well-appearing   Aspiration/Injection Procedure Note Todd Mckay Jan 11, 1928  Procedure: Injection Indications: right knee pain   Procedure Details Consent: Risks of procedure as well as the alternatives and risks of each were explained to the (patient/caregiver).  Consent for procedure obtained. Time Out: Verified patient identification, verified procedure, site/side was marked, verified correct patient position, special equipment/implants available, medications/allergies/relevent history reviewed, required imaging and test results available.  Performed.  The area was cleaned with  iodine and alcohol swabs.    The right knee superior lateral suprapatellar pouch was injected using 3  cc's of 1% lidocaine with a 22 1 1/2" needle.  The syringe was then switched and a mixture containing Orthovisc was injected.  Ultrasound was used. Images were obtained in  Long views showing the injection.    A sterile dressing was applied.  Patient did tolerate procedure well.       ASSESSMENT & PLAN:   OA (osteoarthritis) of knee Completed his Orthovisc injections. -Can follow-up in 1 month if needed.

## 2018-02-18 NOTE — Assessment & Plan Note (Signed)
Completed his Orthovisc injections. -Can follow-up in 1 month if needed.

## 2018-02-18 NOTE — Patient Instructions (Signed)
Good to see you  Please see me in one month.

## 2018-05-30 ENCOUNTER — Ambulatory Visit (INDEPENDENT_AMBULATORY_CARE_PROVIDER_SITE_OTHER): Payer: Medicare Other

## 2018-05-30 ENCOUNTER — Encounter: Payer: Self-pay | Admitting: Family Medicine

## 2018-05-30 ENCOUNTER — Ambulatory Visit: Payer: Medicare Other | Admitting: Family Medicine

## 2018-05-30 VITALS — BP 110/60 | HR 71 | Temp 97.4°F | Ht 69.7 in | Wt 216.0 lb

## 2018-05-30 DIAGNOSIS — R0609 Other forms of dyspnea: Secondary | ICD-10-CM

## 2018-05-30 DIAGNOSIS — E441 Mild protein-calorie malnutrition: Secondary | ICD-10-CM

## 2018-05-30 DIAGNOSIS — I4891 Unspecified atrial fibrillation: Secondary | ICD-10-CM

## 2018-05-30 DIAGNOSIS — E039 Hypothyroidism, unspecified: Secondary | ICD-10-CM

## 2018-05-30 DIAGNOSIS — R413 Other amnesia: Secondary | ICD-10-CM

## 2018-05-30 NOTE — Progress Notes (Signed)
Established Patient Office Visit  Subjective:  Patient ID: Todd Mckay, male    DOB: 21-May-1927  Age: 85 y.o. MRN: [id number]  CC:  Chief Complaint  Patient presents with  . Follow-up    HPI [name] presents for follow-up of his insomnia that has actually improved.  Patient's wife says that he does snore some but has not witnessed any apneic episodes.  He is currently sleeping through the night but still takes some afternoon naps.  He still reports a decrease in energy with some dyspnea on exertion.  Recent diagnosis of atrial fib.  Just saw cardiology who ordered blood work.  Review of the blood work shows an increase in his TSH to 4.087 from 2.75.  Patient and wife assured me that he has been taking his Synthroid every morning an hour before eating.  She does lay the pills out in a pillbox for him to take so that she can monitor his compliance.  Carefully explained the relationship between atrial fib and over replaced hypothyroidism.  Albumin and protein levels were were low on recent CMP as well.  Patient admits that his appetite has decreased some over the last few weeks.  Past Medical History:  Diagnosis Date  . GERD (gastroesophageal reflux disease)   . Glaucoma   . Thyroid disease     Past Surgical History:  Procedure Laterality Date  . CATARACT EXTRACTION Right   . CATARACT EXTRACTION Left     History reviewed. No pertinent family history.  Social History   Socioeconomic History  . Marital status: Married    Spouse name: Not on file  . Number of children: Not on file  . Years of education: Not on file  . Highest education level: Not on file  Occupational History  . Not on file  Social Needs  . Financial resource strain: Not on file  . Food insecurity:    Worry: Not on file    Inability: Not on file  . Transportation needs:    Medical: Not on file    Non-medical: Not on file  Tobacco Use  . Smoking status: Former Games developer  . Smokeless tobacco: Never Used   Substance and Sexual Activity  . Alcohol use: Yes    Alcohol/week: 19.0 standard drinks    Types: 12 Glasses of wine, 7 Shots of liquor per week    Comment: One glass of wine with lunch and dinner. Vodka martina in the late afternoon.  . Drug use: Not on file  . Sexual activity: Not on file  Lifestyle  . Physical activity:    Days per week: Not on file    Minutes per session: Not on file  . Stress: Not on file  Relationships  . Social connections:    Talks on phone: Not on file    Gets together: Not on file    Attends religious service: Not on file    Active member of club or organization: Not on file    Attends meetings of clubs or organizations: Not on file    Relationship status: Not on file  . Intimate partner violence:    Fear of current or ex partner: Not on file    Emotionally abused: Not on file    Physically abused: Not on file    Forced sexual activity: Not on file  Other Topics Concern  . Not on file  Social History Narrative  . Not on file    Outpatient Medications Prior to Visit  Medication  Sig Dispense Refill  . apixaban (ELIQUIS) 5 MG TABS tablet Take by mouth.    . diltiazem (CARDIZEM CD) 180 MG 24 hr capsule Take 1 capsule by mouth daily.    Marland Kitchen donepezil (ARICEPT) 10 MG tablet Take 1 tablet by mouth daily.  3  . dorzolamide-timolol (COSOPT) 22.3-6.8 MG/ML ophthalmic solution Instill 1 drop in both eyes twice daily.  2  . latanoprost (XALATAN) 0.005 % ophthalmic solution INT 1 GTT IN OD Q NIGHT  1  . levothyroxine (SYNTHROID, LEVOTHROID) 75 MCG tablet Take 1 tablet (75 mcg total) by mouth daily. 100 tablet 4  . metoprolol tartrate (LOPRESSOR) 25 MG tablet Take by mouth.    Marland Kitchen omeprazole (PRILOSEC) 20 MG capsule Take 1 capsule (20 mg total) by mouth daily. 90 capsule 4  . pilocarpine (PILOCAR) 1 % ophthalmic solution INT 1 GTT IN OD TID  11  . traZODone (DESYREL) 50 MG tablet Take 0.5-1 tablets (25-50 mg total) by mouth at bedtime as needed for sleep. 30  tablet 1  . diltiazem (CARDIZEM SR) 60 MG 12 hr capsule Take by mouth.     No facility-administered medications prior to visit.     Allergies  Allergen Reactions  . Acyclovir And Related   . Erythromycin   . Erythromycin Base Hives    Derm. Hives  . Penicillins Hives    Derm, Hives     ROS Review of Systems  Constitutional: Positive for fatigue. Negative for diaphoresis, fever and unexpected weight change.  HENT: Negative.   Eyes: Negative for photophobia.  Respiratory: Positive for shortness of breath. Negative for chest tightness and wheezing.   Cardiovascular: Negative for chest pain and palpitations.  Gastrointestinal: Negative.   Endocrine: Negative for cold intolerance and heat intolerance.  Genitourinary: Negative.   Musculoskeletal: Negative for gait problem and joint swelling.  Skin: Negative for pallor and rash.  Allergic/Immunologic: Negative for immunocompromised state.  Neurological: Negative for light-headedness and headaches.  Hematological: Does not bruise/bleed easily.  Psychiatric/Behavioral: Negative for confusion and sleep disturbance.      Objective:    Physical Exam  Constitutional: He is oriented to person, place, and time. He appears well-developed and well-nourished. No distress.  HENT:  Head: Normocephalic and atraumatic.  Right Ear: External ear normal.  Left Ear: External ear normal.  Mouth/Throat: Oropharynx is clear and moist. No oropharyngeal exudate.  Eyes: Pupils are equal, round, and reactive to light. Conjunctivae are normal. Right eye exhibits no discharge. Left eye exhibits no discharge. No scleral icterus.  Neck: Neck supple. No JVD present. No tracheal deviation present. No thyromegaly present.  Cardiovascular: An irregularly irregular rhythm present.  Pulmonary/Chest: Effort normal. No stridor. He has decreased breath sounds. He has no wheezes. He has no rhonchi. He has no rales.  Lymphadenopathy:    He has no cervical  adenopathy.  Neurological: He is alert and oriented to person, place, and time.  Skin: Skin is warm and dry. He is not diaphoretic.  Psychiatric: He has a normal mood and affect. His behavior is normal.    BP 110/60   Pulse 71   Temp (!) 97.4 F (36.3 C) (Oral)   Ht 5' 9.7" (1.77 m)   Wt 216 lb (98 kg)   SpO2 99%   BMI 31.26 kg/m  Wt Readings from Last 3 Encounters:  05/30/18 216 lb (98 kg)  02/04/18 214 lb (97.1 kg)  12/24/17 226 lb (102.5 kg)   BP Readings from Last 3 Encounters:  05/30/18 110/60  02/04/18 118/76  12/24/17 130/80   Guideline developer:  UpToDate (see UpToDate for funding source) Date Released: June 2014  Health Maintenance Due  Topic Date Due  . TETANUS/TDAP  07/27/1948  . INFLUENZA VACCINE  11/28/2017    There are no preventive care reminders to display for this patient.  Lab Results  Component Value Date   TSH 2.75 09/02/2017   Lab Results  Component Value Date   WBC 6.1 09/02/2017   HGB 15.3 09/02/2017   HCT 44.6 09/02/2017   MCV 97.5 09/02/2017   PLT 159.0 09/02/2017   Lab Results  Component Value Date   NA 140 09/02/2017   K 4.4 09/02/2017   CO2 27 09/02/2017   GLUCOSE 93 09/02/2017   BUN 18 09/02/2017   CREATININE 1.08 09/02/2017   BILITOT 1.3 (H) 02/05/2007   ALKPHOS 53 02/05/2007   AST 37 02/05/2007   ALT 27 02/05/2007   PROT 7.5 02/05/2007   ALBUMIN 4.3 02/05/2007   CALCIUM 9.2 09/02/2017   GFR 68.57 09/02/2017   No results found for: CHOL No results found for: HDL No results found for: LDLCALC No results found for: TRIG No results found for: CHOLHDL No results found for: [id number]    Assessment & Plan:   Problem List Items Addressed This Visit      Cardiovascular and Mediastinum   Atrial fibrillation (HCC)   Relevant Medications   diltiazem (CARDIZEM CD) 180 MG 24 hr capsule     Endocrine   Hypothyroidism - Primary     Other   DOE (dyspnea on exertion)   Relevant Orders   DG Chest 2 View   Ambulatory  referral to Pulmonology   Memory difficulties      No orders of the defined types were placed in this encounter.   Follow-up: Return in about 3 months (around 08/28/2018).   TSH is increased on her current dose of levothyroxine at 75 mcg.  As indicated above discussed the importance of compliance with this medicine.  We will hold her current level and recheck in 3 months.  We will send for pulmonary consultation for assessment of DOE.  Chest x-ray checked today.  Encourage patient to increased nutrition.  We will follow-up in 3 months.

## 2018-07-07 ENCOUNTER — Institutional Professional Consult (permissible substitution): Payer: Medicare Other | Admitting: Pulmonary Disease

## 2018-07-07 IMAGING — DX DG KNEE COMPLETE 4+V*R*
4 series · 4 of 4 positions shown · non-contrast
Comparison: None.

CLINICAL DATA: Bilateral knee pain for 1 month.

EXAM:
RIGHT KNEE - COMPLETE 4+ VIEW

[knee ap]
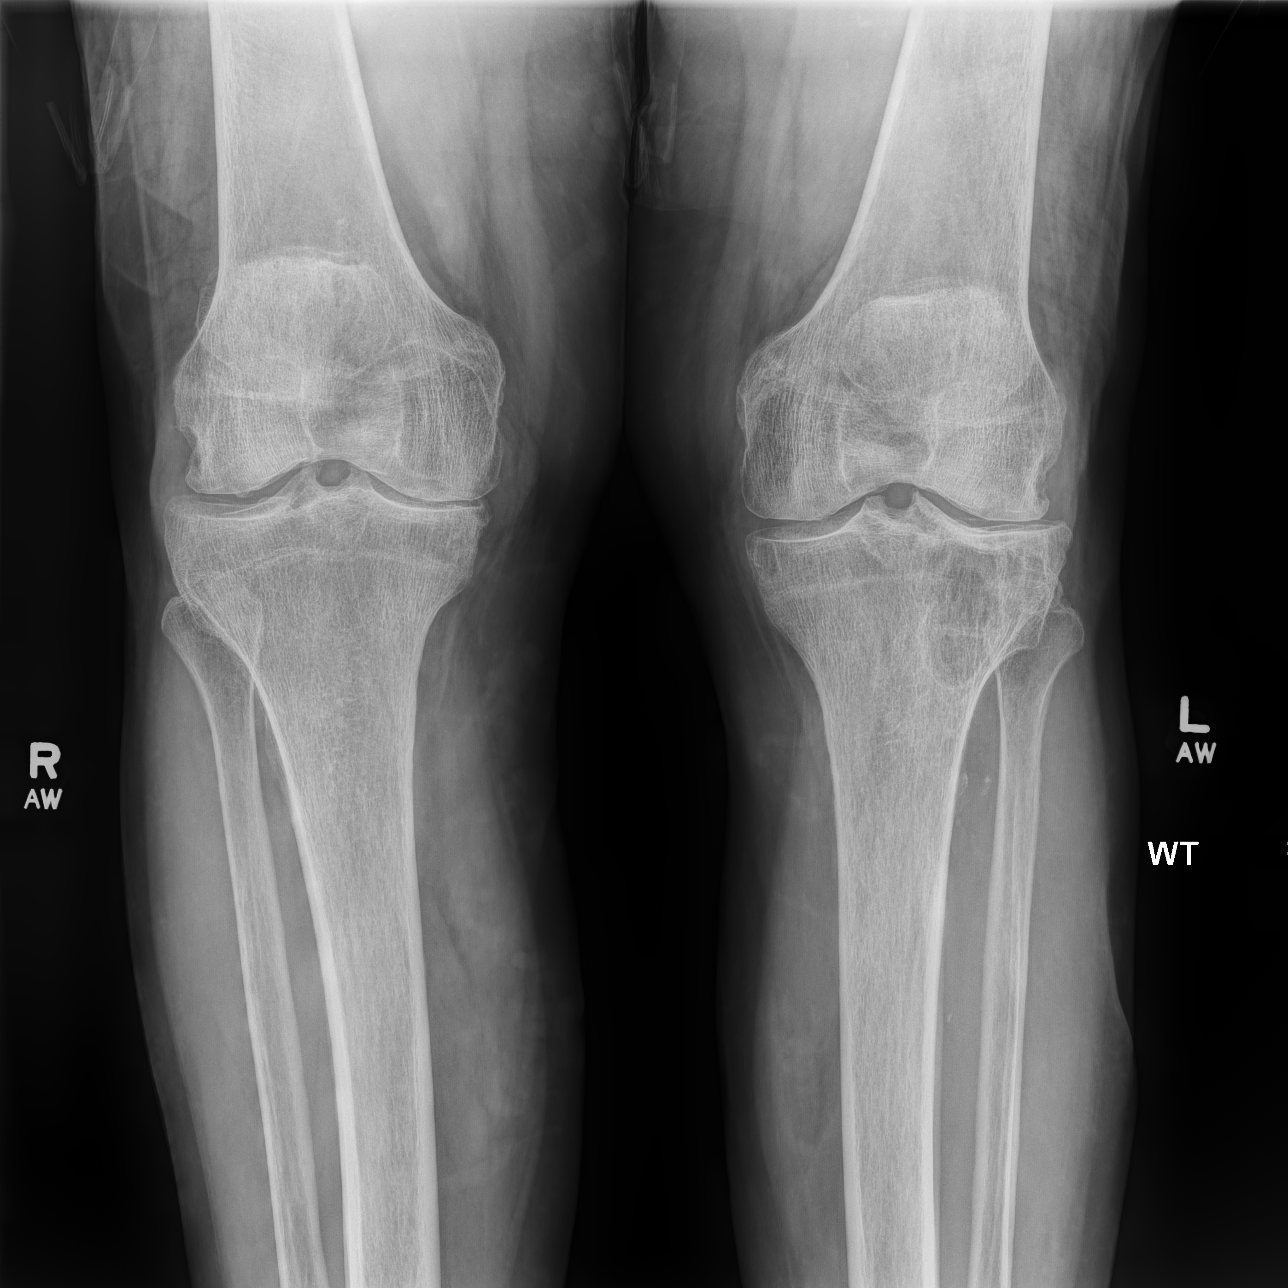

[knee [person_name] view pa]
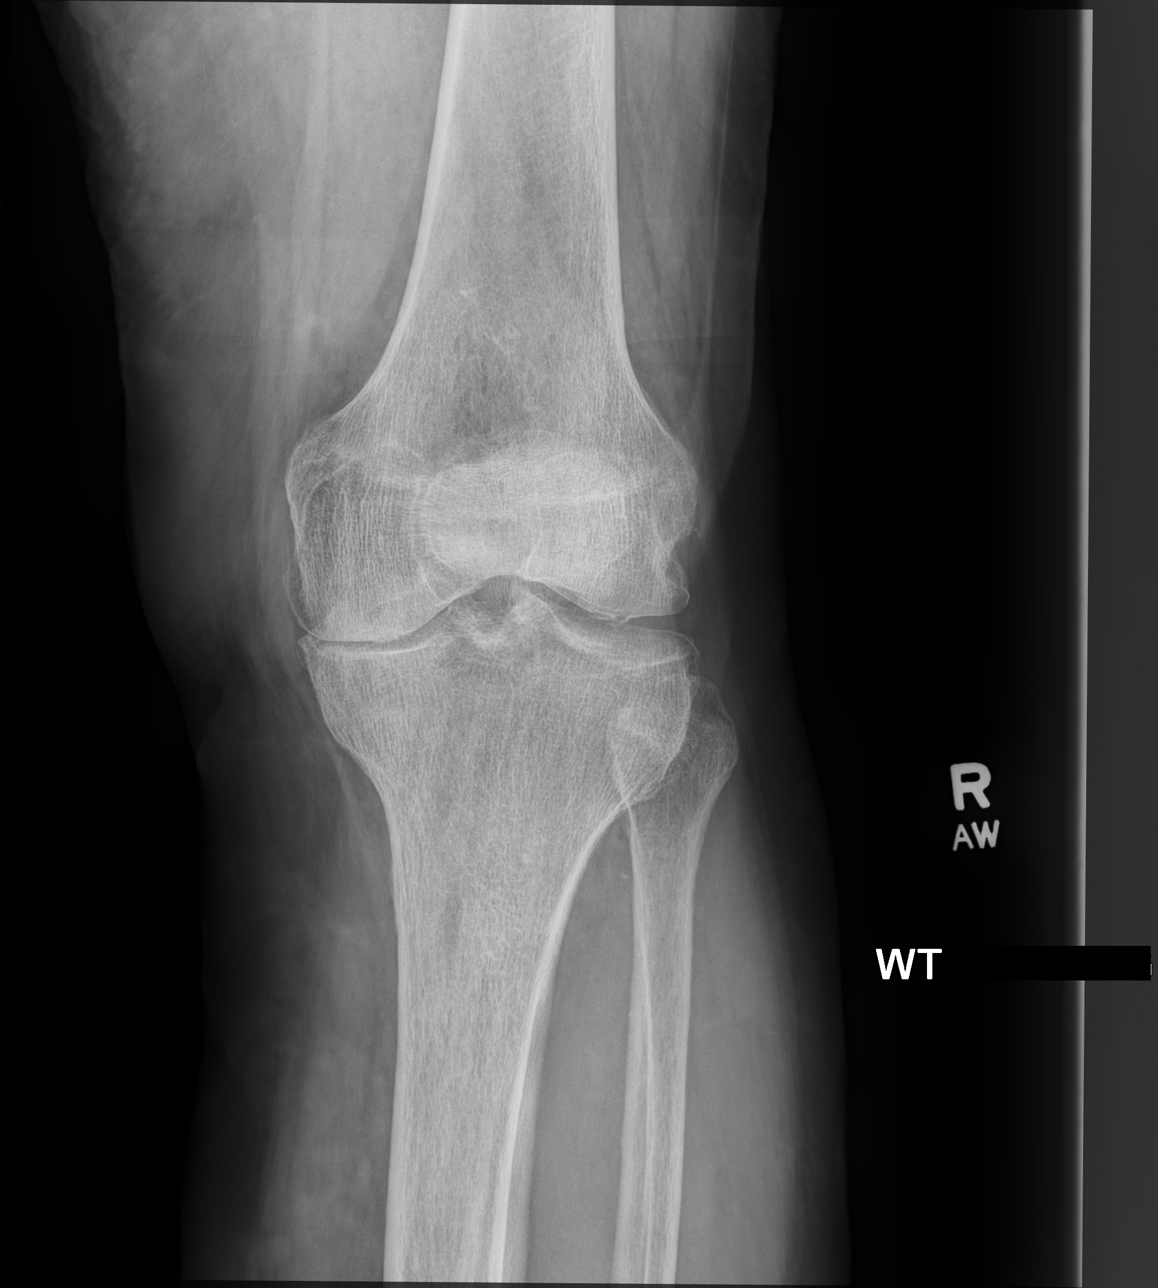

[knee lat]
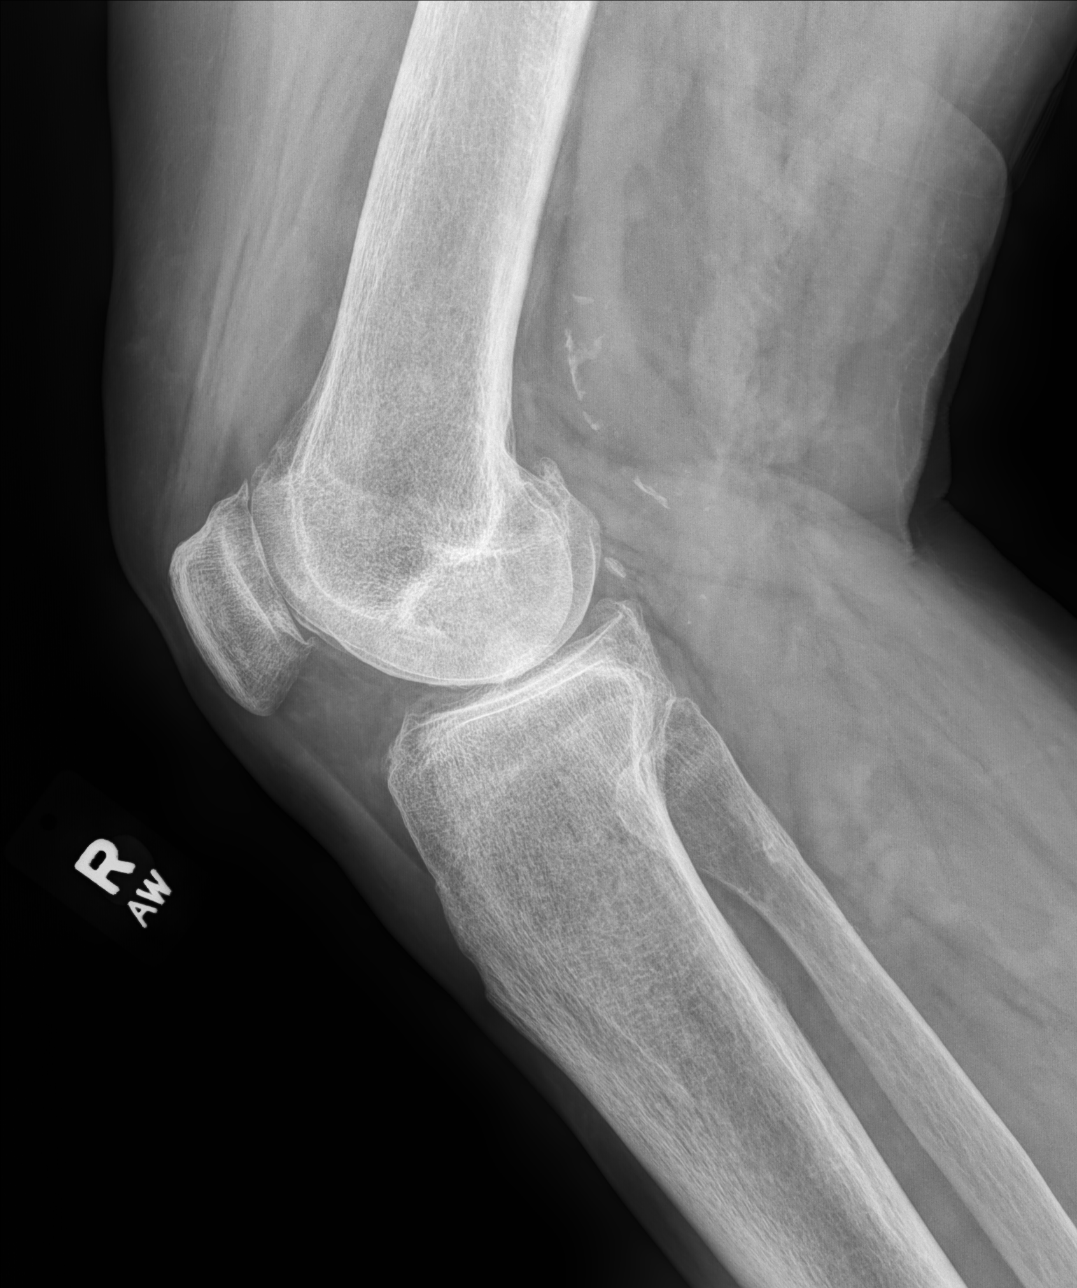

[patella (sunrise)]
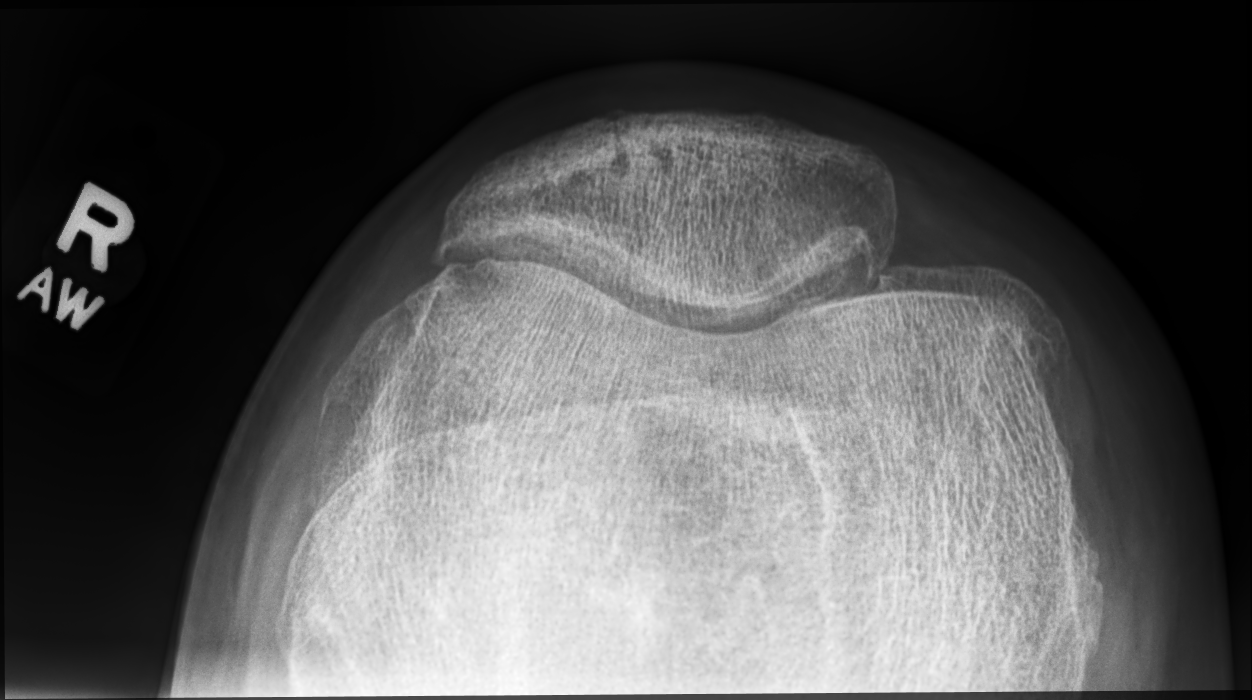

[4 of 4 positions shown; findings below may reference images not displayed]

FINDINGS: No evidence of fracture, dislocation, or joint effusion. Moderate
narrowing of medial joint space is noted. Mild narrowing of
patellofemoral space is noted. Soft tissues are unremarkable.
IMPRESSION: Moderate degenerative joint disease. No acute abnormality seen in
the right knee.

## 2018-08-26 ENCOUNTER — Ambulatory Visit (INDEPENDENT_AMBULATORY_CARE_PROVIDER_SITE_OTHER): Payer: Medicare Other | Admitting: Family Medicine

## 2018-08-26 ENCOUNTER — Encounter: Payer: Self-pay | Admitting: Family Medicine

## 2018-08-26 VITALS — Ht 69.7 in

## 2018-08-26 DIAGNOSIS — N183 Chronic kidney disease, stage 3 unspecified: Secondary | ICD-10-CM

## 2018-08-26 DIAGNOSIS — E039 Hypothyroidism, unspecified: Secondary | ICD-10-CM | POA: Diagnosis not present

## 2018-08-26 NOTE — Progress Notes (Signed)
Virtual Visit via Telephone Note  I connected with [name] on 08/26/18 at 10:00 AM EDT by telephone and verified that I am speaking with the correct person using two identifiers.   I discussed the limitations, risks, security and privacy concerns of performing an evaluation and management service by telephone and the availability of in person appointments. I also discussed with the patient that there may be a patient responsible charge related to this service. The patient expressed understanding and agreed to proceed.  Interactive video and audio telecommunications were attempted between myself and the patient. However they failed due to the patient having technical difficulties or not having access to video capability. We continued and completed with audio only.     History of Present Illness:    Observations/Objective:   Assessment and Plan:   Follow Up Instructions:    I discussed the assessment and treatment plan with the patient. The patient was provided an opportunity to ask questions and all were answered. The patient agreed with the plan and demonstrated an understanding of the instructions.   The patient was advised to call back or seek an in-person evaluation if the symptoms worsen or if the condition fails to improve as anticipated.  I provided 20   Established Patient Office Visit  Subjective:  Patient ID: Todd Mckay, male    DOB: October 21, 1927  Age: 85 y.o. MRN: [id number]  CC:  Chief Complaint  Patient presents with  . Follow-up    HPI [name] presents for follow-up of his hypothyroidism.  Patient was reached by phone and I spoke with him and his wife through the speaker phone.  He has been doing well taking his medicines as directed.  Dyspnea on exertion has resolved.  He is able to get out in the yard work in the garden and walk around his neighborhood without issue.  Continues to shelter in place with the current pandemic.  Past Medical History:   Diagnosis Date  . GERD (gastroesophageal reflux disease)   . Glaucoma   . Thyroid disease     Past Surgical History:  Procedure Laterality Date  . CATARACT EXTRACTION Right   . CATARACT EXTRACTION Left     History reviewed. No pertinent family history.  Social History   Socioeconomic History  . Marital status: Married    Spouse name: Not on file  . Number of children: Not on file  . Years of education: Not on file  . Highest education level: Not on file  Occupational History  . Not on file  Social Needs  . Financial resource strain: Not on file  . Food insecurity:    Worry: Not on file    Inability: Not on file  . Transportation needs:    Medical: Not on file    Non-medical: Not on file  Tobacco Use  . Smoking status: Former Games developer  . Smokeless tobacco: Never Used  Substance and Sexual Activity  . Alcohol use: Yes    Alcohol/week: 19.0 standard drinks    Types: 12 Glasses of wine, 7 Shots of liquor per week    Comment: One glass of wine with lunch and dinner. Vodka martina in the late afternoon.  . Drug use: Not on file  . Sexual activity: Not on file  Lifestyle  . Physical activity:    Days per week: Not on file    Minutes per session: Not on file  . Stress: Not on file  Relationships  . Social connections:  Talks on phone: Not on file    Gets together: Not on file    Attends religious service: Not on file    Active member of club or organization: Not on file    Attends meetings of clubs or organizations: Not on file    Relationship status: Not on file  . Intimate partner violence:    Fear of current or ex partner: Not on file    Emotionally abused: Not on file    Physically abused: Not on file    Forced sexual activity: Not on file  Other Topics Concern  . Not on file  Social History Narrative  . Not on file    Outpatient Medications Prior to Visit  Medication Sig Dispense Refill  . apixaban (ELIQUIS) 5 MG TABS tablet Take by mouth.    .  diltiazem (CARDIZEM CD) 180 MG 24 hr capsule Take 1 capsule by mouth daily.    Todd Mckay. donepezil (ARICEPT) 10 MG tablet Take 1 tablet by mouth daily.  3  . dorzolamide-timolol (COSOPT) 22.3-6.8 MG/ML ophthalmic solution Instill 1 drop in both eyes twice daily.  2  . latanoprost (XALATAN) 0.005 % ophthalmic solution INT 1 GTT IN OD Q NIGHT  1  . levothyroxine (SYNTHROID, LEVOTHROID) 75 MCG tablet Take 1 tablet (75 mcg total) by mouth daily. 100 tablet 4  . metoprolol tartrate (LOPRESSOR) 25 MG tablet Take by mouth.    Todd Mckay. omeprazole (PRILOSEC) 20 MG capsule Take 1 capsule (20 mg total) by mouth daily. 90 capsule 4  . pilocarpine (PILOCAR) 1 % ophthalmic solution INT 1 GTT IN OD TID  11  . traZODone (DESYREL) 50 MG tablet Take 0.5-1 tablets (25-50 mg total) by mouth at bedtime as needed for sleep. 30 tablet 1   No facility-administered medications prior to visit.     Allergies  Allergen Reactions  . Acyclovir And Related   . Erythromycin   . Erythromycin Base Hives    Derm. Hives  . Penicillins Hives    Derm, Hives     ROS Review of Systems  Constitutional: Negative.   HENT: Negative.   Respiratory: Negative for chest tightness, shortness of breath and wheezing.   Cardiovascular: Negative.   Endocrine: Negative for cold intolerance and heat intolerance.  Psychiatric/Behavioral: Negative for behavioral problems. The patient is not nervous/anxious.       Objective:    Physical Exam  Constitutional: He is oriented to person, place, and time. No distress.  Pulmonary/Chest: Effort normal.  Neurological: He is alert and oriented to person, place, and time.  Psychiatric: He has a normal mood and affect. His behavior is normal.    Ht 5' 9.7" (1.77 m)   BMI 31.26 kg/m  Wt Readings from Last 3 Encounters:  05/30/18 216 lb (98 kg)  02/04/18 214 lb (97.1 kg)  12/24/17 226 lb (102.5 kg)     Health Maintenance Due  Topic Date Due  . TETANUS/TDAP  07/27/1948    There are no  preventive care reminders to display for this patient.  Lab Results  Component Value Date   TSH 2.75 09/02/2017   Lab Results  Component Value Date   WBC 6.1 09/02/2017   HGB 15.3 09/02/2017   HCT 44.6 09/02/2017   MCV 97.5 09/02/2017   PLT 159.0 09/02/2017   Lab Results  Component Value Date   NA 140 09/02/2017   K 4.4 09/02/2017   CO2 27 09/02/2017   GLUCOSE 93 09/02/2017   BUN 18 09/02/2017  CREATININE 1.08 09/02/2017   BILITOT 1.3 (H) 02/05/2007   ALKPHOS 53 02/05/2007   AST 37 02/05/2007   ALT 27 02/05/2007   PROT 7.5 02/05/2007   ALBUMIN 4.3 02/05/2007   CALCIUM 9.2 09/02/2017   GFR 68.57 09/02/2017   No results found for: CHOL No results found for: HDL No results found for: LDLCALC No results found for: TRIG No results found for: CHOLHDL No results found for: OITG5Q    Assessment & Plan:   Problem List Items Addressed This Visit      Endocrine   Hypothyroidism - Primary   Relevant Orders   CBC   TSH     Genitourinary   CKD (chronic kidney disease) stage 3, GFR 30-59 ml/min (HCC)   Relevant Orders   CBC   Basic metabolic panel      No orders of the defined types were placed in this encounter.   Follow-up: Return in about 3 months (around 11/25/2018).    Mliss Sax, MD 15 minutes of non-face-to-face time during this encounter.

## 2018-09-30 ENCOUNTER — Other Ambulatory Visit: Payer: Self-pay | Admitting: Family Medicine

## 2018-09-30 DIAGNOSIS — K219 Gastro-esophageal reflux disease without esophagitis: Secondary | ICD-10-CM

## 2018-10-27 ENCOUNTER — Other Ambulatory Visit: Payer: Self-pay | Admitting: Family Medicine

## 2018-10-27 ENCOUNTER — Telehealth: Payer: Self-pay

## 2018-10-27 DIAGNOSIS — E039 Hypothyroidism, unspecified: Secondary | ICD-10-CM

## 2018-10-27 NOTE — Telephone Encounter (Signed)
Sent in RX for 30 pills. Needs to have blood work drawn that was ordered back in April.

## 2018-10-27 NOTE — Telephone Encounter (Signed)
Done

## 2018-10-27 NOTE — Telephone Encounter (Signed)
Message sent to scheduling 

## 2018-10-27 NOTE — Telephone Encounter (Signed)
Can you call and get pt scheduled for a lab visit? The orders are already in and he does need to be fasting.

## 2018-10-28 ENCOUNTER — Other Ambulatory Visit (INDEPENDENT_AMBULATORY_CARE_PROVIDER_SITE_OTHER): Payer: Medicare Other

## 2018-10-28 DIAGNOSIS — N183 Chronic kidney disease, stage 3 unspecified: Secondary | ICD-10-CM

## 2018-10-28 DIAGNOSIS — E039 Hypothyroidism, unspecified: Secondary | ICD-10-CM | POA: Diagnosis not present

## 2018-10-28 LAB — CBC
HCT: 44.2 % (ref 39.0–52.0)
Hemoglobin: 14.7 g/dL (ref 13.0–17.0)
MCHC: 33.3 g/dL (ref 30.0–36.0)
MCV: 98.8 fl (ref 78.0–100.0)
Platelets: 128 10*3/uL — ABNORMAL LOW (ref 150.0–400.0)
RBC: 4.47 Mil/uL (ref 4.22–5.81)
RDW: 14.2 % (ref 11.5–15.5)
WBC: 6.7 10*3/uL (ref 4.0–10.5)

## 2018-10-28 LAB — TSH: TSH: 3.65 u[IU]/mL (ref 0.35–4.50)

## 2018-11-10 ENCOUNTER — Other Ambulatory Visit: Payer: Self-pay | Admitting: Family Medicine

## 2018-11-10 DIAGNOSIS — E039 Hypothyroidism, unspecified: Secondary | ICD-10-CM

## 2018-12-04 ENCOUNTER — Other Ambulatory Visit: Payer: Self-pay | Admitting: Family Medicine

## 2018-12-04 DIAGNOSIS — G4700 Insomnia, unspecified: Secondary | ICD-10-CM

## 2019-03-25 IMAGING — DX DG CHEST 2V
2 series · 2 of 2 positions shown · non-contrast
Comparison: None.

CLINICAL DATA: Shortness of breath with exertion

EXAM:
CHEST - 2 VIEW

[chest pa]
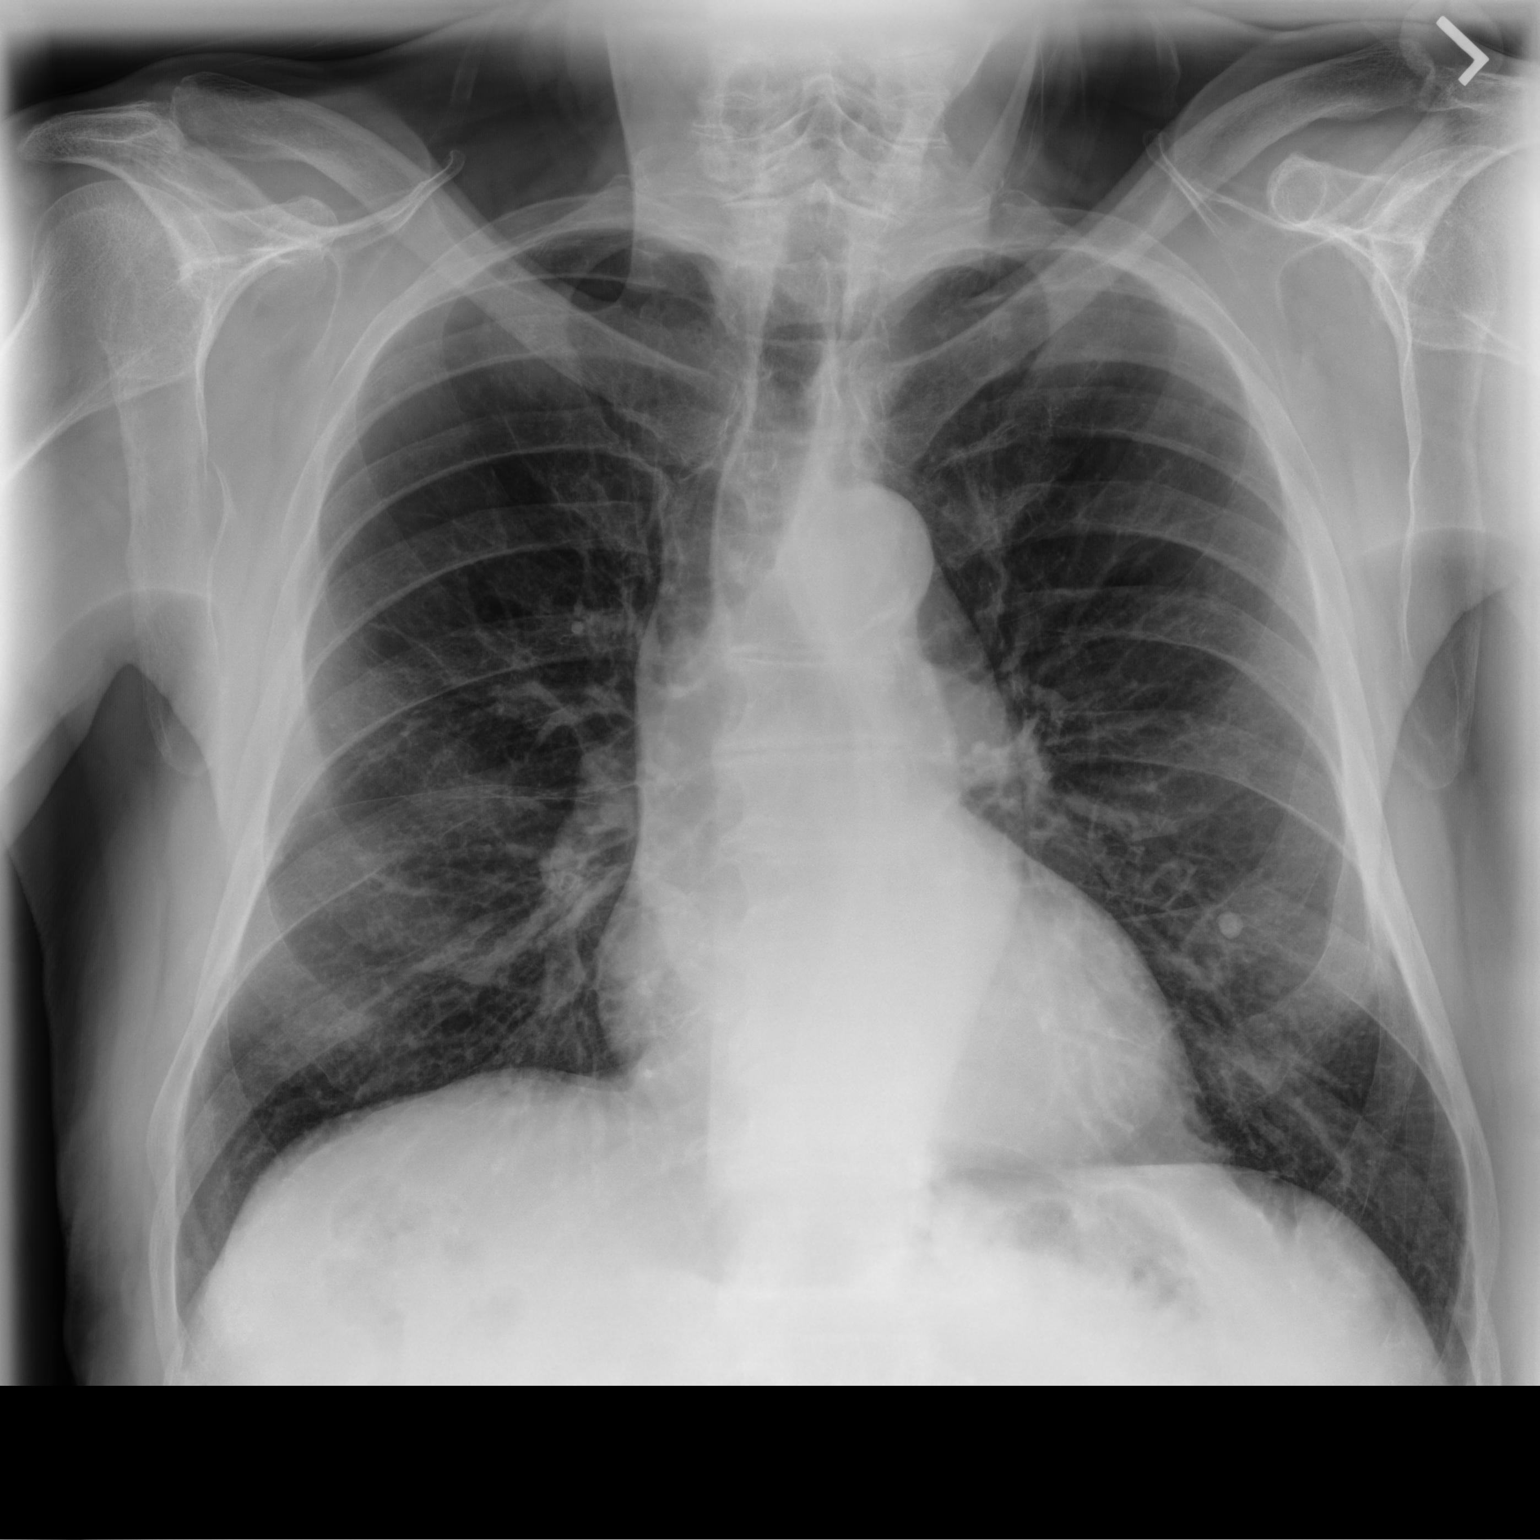

[chest lat]
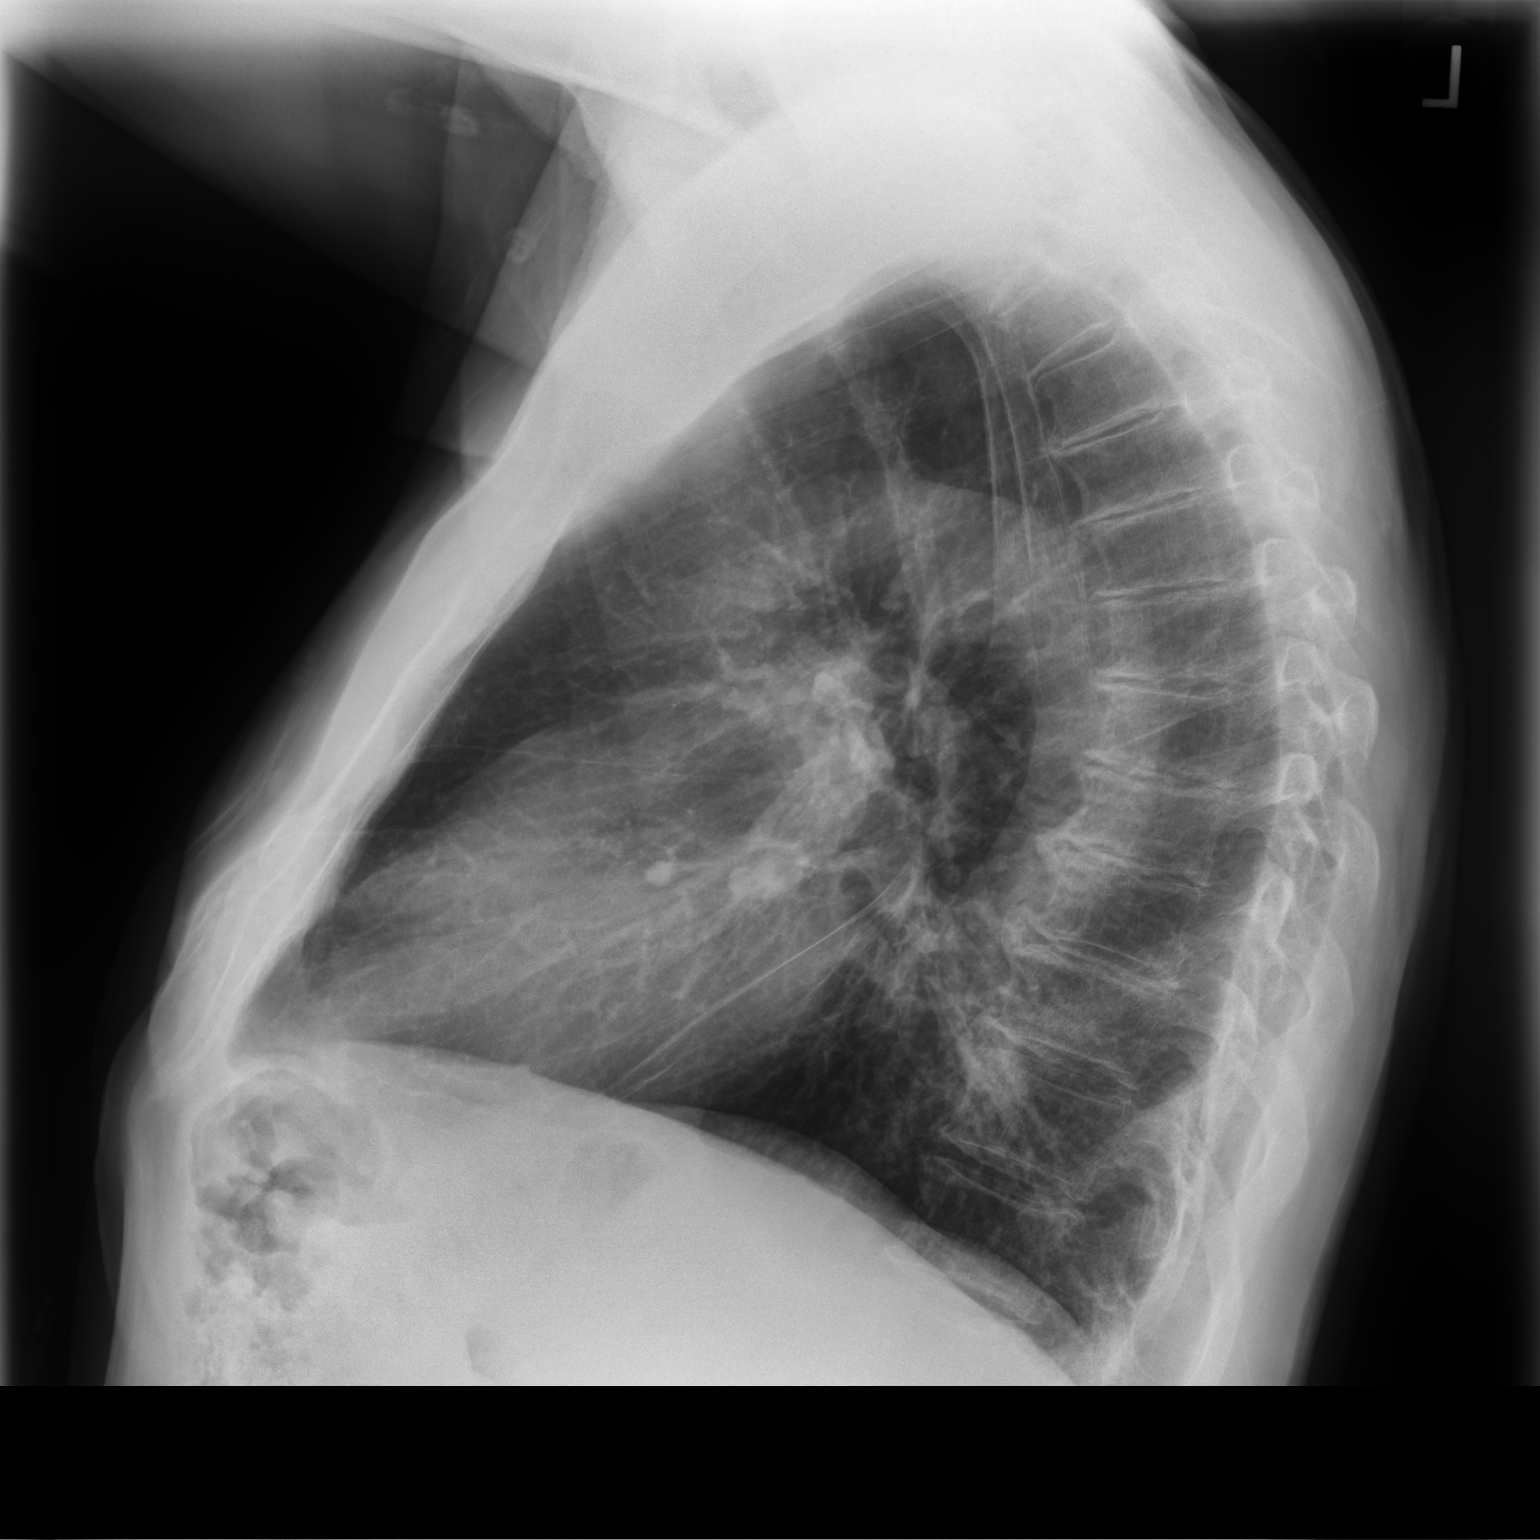

[2 of 2 positions shown; findings below may reference images not displayed]

FINDINGS: Calcified granuloma in the left mid lung. Heart and mediastinal
contours are within normal limits. No focal opacities or effusions.
No acute bony abnormality.
IMPRESSION: No active cardiopulmonary disease.

## 2019-07-02 DIAGNOSIS — H401113 Primary open-angle glaucoma, right eye, severe stage: Secondary | ICD-10-CM | POA: Diagnosis not present

## 2019-07-02 DIAGNOSIS — H401121 Primary open-angle glaucoma, left eye, mild stage: Secondary | ICD-10-CM | POA: Diagnosis not present

## 2019-07-31 ENCOUNTER — Ambulatory Visit: Payer: Medicare PPO | Attending: Internal Medicine

## 2019-07-31 DIAGNOSIS — Z23 Encounter for immunization: Secondary | ICD-10-CM

## 2019-07-31 NOTE — Progress Notes (Signed)
   Covid-19 Vaccination Clinic  Name:  Todd Mckay    MRN: [id number] DOB: [birth year]  07/31/2019  Todd Mckay was observed post Covid-19 immunization for 15 minutes without incident. He was provided with Vaccine Information Sheet and instruction to access the V-Safe system.   Todd Mckay was instructed to call 911 with any severe reactions post vaccine: Marland Kitchen Difficulty breathing  . Swelling of face and throat  . A fast heartbeat  . A bad rash all over body  . Dizziness and weakness   Immunizations Administered    Name Date Dose VIS Date Route   Pfizer COVID-19 Vaccine 07/31/2019 11:04 AM 0.3 mL 04/10/2019 Intramuscular   Manufacturer: [employer], [employer]   Lot: [id number]   NDC: [address]-8

## 2019-08-22 ENCOUNTER — Other Ambulatory Visit: Payer: Self-pay | Admitting: Family Medicine

## 2019-08-22 DIAGNOSIS — E039 Hypothyroidism, unspecified: Secondary | ICD-10-CM

## 2019-08-24 ENCOUNTER — Ambulatory Visit: Payer: Medicare PPO | Attending: Internal Medicine

## 2019-08-24 DIAGNOSIS — Z23 Encounter for immunization: Secondary | ICD-10-CM

## 2019-08-24 NOTE — Progress Notes (Signed)
   Covid-19 Vaccination Clinic  Name:  Kimo Bancroft    MRN: [id number] DOB: May 01, 1927  08/24/2019  Mr. Marsalis was observed post Covid-19 immunization for 15 minutes without incident. He was provided with Vaccine Information Sheet and instruction to access the V-Safe system.   Mr. Buckalew was instructed to call 911 with any severe reactions post vaccine: Marland Kitchen Difficulty breathing  . Swelling of face and throat  . A fast heartbeat  . A bad rash all over body  . Dizziness and weakness   Immunizations Administered    Name Date Dose VIS Date Route   Pfizer COVID-19 Vaccine 08/24/2019 12:19 PM 0.3 mL 06/24/2018 Intramuscular   Manufacturer: [employer], [employer]   Lot: [id number]   NDC: [address]-0

## 2019-09-02 DIAGNOSIS — E039 Hypothyroidism, unspecified: Secondary | ICD-10-CM | POA: Diagnosis not present

## 2019-09-02 DIAGNOSIS — N183 Chronic kidney disease, stage 3 unspecified: Secondary | ICD-10-CM | POA: Diagnosis not present

## 2019-09-02 DIAGNOSIS — K219 Gastro-esophageal reflux disease without esophagitis: Secondary | ICD-10-CM | POA: Diagnosis not present

## 2019-09-02 DIAGNOSIS — R413 Other amnesia: Secondary | ICD-10-CM | POA: Diagnosis not present

## 2019-09-02 DIAGNOSIS — G4700 Insomnia, unspecified: Secondary | ICD-10-CM | POA: Diagnosis not present

## 2019-09-02 DIAGNOSIS — M1711 Unilateral primary osteoarthritis, right knee: Secondary | ICD-10-CM | POA: Diagnosis not present

## 2019-09-02 DIAGNOSIS — I4811 Longstanding persistent atrial fibrillation: Secondary | ICD-10-CM | POA: Diagnosis not present

## 2019-10-14 DIAGNOSIS — Z0000 Encounter for general adult medical examination without abnormal findings: Secondary | ICD-10-CM | POA: Diagnosis not present

## 2020-03-01 DIAGNOSIS — M25461 Effusion, right knee: Secondary | ICD-10-CM | POA: Diagnosis not present

## 2020-03-01 DIAGNOSIS — H6123 Impacted cerumen, bilateral: Secondary | ICD-10-CM | POA: Diagnosis not present

## 2020-05-13 ENCOUNTER — Other Ambulatory Visit: Payer: Self-pay | Admitting: Family Medicine

## 2020-05-13 DIAGNOSIS — E039 Hypothyroidism, unspecified: Secondary | ICD-10-CM

## 2020-06-21 DIAGNOSIS — H52201 Unspecified astigmatism, right eye: Secondary | ICD-10-CM | POA: Diagnosis not present

## 2020-06-21 DIAGNOSIS — H47291 Other optic atrophy, right eye: Secondary | ICD-10-CM | POA: Diagnosis not present

## 2020-06-21 DIAGNOSIS — H5212 Myopia, left eye: Secondary | ICD-10-CM | POA: Diagnosis not present

## 2020-06-21 DIAGNOSIS — Z961 Presence of intraocular lens: Secondary | ICD-10-CM | POA: Diagnosis not present

## 2020-06-21 DIAGNOSIS — H401121 Primary open-angle glaucoma, left eye, mild stage: Secondary | ICD-10-CM | POA: Diagnosis not present

## 2020-06-21 DIAGNOSIS — H02055 Trichiasis without entropian left lower eyelid: Secondary | ICD-10-CM | POA: Diagnosis not present

## 2020-06-21 DIAGNOSIS — H401113 Primary open-angle glaucoma, right eye, severe stage: Secondary | ICD-10-CM | POA: Diagnosis not present

## 2020-06-21 DIAGNOSIS — H524 Presbyopia: Secondary | ICD-10-CM | POA: Diagnosis not present

## 2020-06-21 DIAGNOSIS — H5201 Hypermetropia, right eye: Secondary | ICD-10-CM | POA: Diagnosis not present

## 2020-07-19 DIAGNOSIS — H401113 Primary open-angle glaucoma, right eye, severe stage: Secondary | ICD-10-CM | POA: Diagnosis not present

## 2020-07-19 DIAGNOSIS — H401121 Primary open-angle glaucoma, left eye, mild stage: Secondary | ICD-10-CM | POA: Diagnosis not present

## 2020-09-05 DIAGNOSIS — Z23 Encounter for immunization: Secondary | ICD-10-CM | POA: Diagnosis not present

## 2020-09-14 DIAGNOSIS — L723 Sebaceous cyst: Secondary | ICD-10-CM | POA: Diagnosis not present

## 2020-09-14 DIAGNOSIS — L089 Local infection of the skin and subcutaneous tissue, unspecified: Secondary | ICD-10-CM | POA: Diagnosis not present

## 2020-10-05 ENCOUNTER — Other Ambulatory Visit: Payer: Self-pay

## 2020-10-05 ENCOUNTER — Encounter: Payer: Self-pay | Admitting: Family Medicine

## 2020-10-05 ENCOUNTER — Ambulatory Visit (INDEPENDENT_AMBULATORY_CARE_PROVIDER_SITE_OTHER): Payer: Medicare PPO | Admitting: Family Medicine

## 2020-10-05 VITALS — BP 120/68 | HR 64 | Temp 97.4°F | Ht 69.5 in | Wt 195.0 lb

## 2020-10-05 DIAGNOSIS — M1711 Unilateral primary osteoarthritis, right knee: Secondary | ICD-10-CM

## 2020-10-05 NOTE — Progress Notes (Signed)
[facility] [address] [place] [address] Dept: [phone] Dept Fax: [phone]  Office Visit  Subjective:    Patient ID: Todd Mckay, male    DOB: 04-May-1927, 87 y.o..   MRN: [id number]  Chief Complaint  Patient presents with  . Acute Visit    C/o having pain in the RT knee x 2- 3 weeks. Has taken Advil with some relief. No known injury.    History of Present Illness:  Patient is in today with a 3-week history of right knee pain. 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 He is not aware of any inciting event recently. The knee pain is mostly anterior. He has been taking some occasional Advill with mild relief. He has not found anything that makes this feel significantly better. he admits that he has had occasional flares of the knee over the years.  Past Medical History: Patient Active Problem List   Diagnosis Date Noted  . Memory difficulties 05/30/2018  . Atrial fibrillation (HCC) 05/30/2018  . Fatigue 12/24/2017  . OA (osteoarthritis) of knee 09/11/2017  . Hypothyroidism 09/02/2017  . Gastroesophageal reflux disease 09/02/2017  . CKD (chronic kidney disease) stage 3, GFR 30-59 ml/min (HCC) 09/02/2017   Past Surgical History:  Procedure Laterality Date  . CATARACT EXTRACTION Right   . CATARACT EXTRACTION Left    No family history on file.  Outpatient Medications Prior to Visit  Medication Sig Dispense Refill  . diltiazem (CARDIZEM CD) 180 MG 24 hr capsule Take 1 capsule by mouth daily.    Marland Kitchen donepezil (ARICEPT) 10 MG tablet Take 1 tablet by mouth daily.  3  . dorzolamide-timolol (COSOPT) 22.3-6.8 MG/ML ophthalmic solution Instill 1 drop in both eyes twice daily.  2  . latanoprost (XALATAN) 0.005 % ophthalmic solution INT 1 GTT IN OD Q NIGHT  1  . levothyroxine (SYNTHROID) 75 MCG tablet TAKE 1 TABLET(75 MCG) BY MOUTH DAILY 90 tablet 2  . metoprolol tartrate (LOPRESSOR) 25 MG  tablet Take by mouth.    Marland Kitchen omeprazole (PRILOSEC) 20 MG capsule TAKE 1 CAPSULE(20 MG) BY MOUTH DAILY 90 capsule 4  . pilocarpine (PILOCAR) 1 % ophthalmic solution INT 1 GTT IN OD TID  11  . traZODone (DESYREL) 50 MG tablet Take 1/2-1qhs prn sleep (must have OV for future fills) 15 tablet 0  . apixaban (ELIQUIS) 5 MG TABS tablet Take by mouth. (Patient not taking: Reported on 10/05/2020)     No facility-administered medications prior to visit.   Allergies  Allergen Reactions  . Acyclovir And Related   . Erythromycin   . Erythromycin Base Hives    Derm. Hives  . Penicillins Hives    Derm, Hives    Objective:   Today's Vitals   10/05/20 1506  BP: 120/68  Pulse: 64  Temp: (!) 97.4 F (36.3 C)  TempSrc: Temporal  SpO2: 99%  Weight: 195 lb (88.5 kg)  Height: 5' 9.5" (1.765 m)   Body mass index is 28.38 kg/m.   General: Well developed, well nourished. No acute distress. Extremities: Well-preserved ROM of the right knee. There is obvious joint hypertrophy. No increased warmth. Mild pain lateral tot he   patella, but no obvious swelling or joint effusion. Mild crepitance with movement. Varus/valgus and Lachman's testing is normal. There   may be a mild click with McMurrya';s testing laterally with deep flexion.  Skin: Warm and dry. No rashes. Psych: Alert and oriented. Normal mood and  affect.  Health Maintenance Due  Topic Date Due  . Pneumococcal Vaccine 62-56 Years old (1 of 4 - PCV13) Never done  . TETANUS/TDAP  Never done  . Zoster Vaccines- Shingrix (1 of 2) Never done     Assessment & Plan:   1. Primary osteoarthritis of right knee Dr. Maurine Minister is not interested in surgical approaches. We discussed use of Tylenol ES up to 1000 mg qid if needed. We also discussed heat with ROM exercises. I feel he could be a candidate for gel injections of the knee, so will refer him to orthopedics to consider this.  - Ambulatory referral to Orthopedic Surgery  Loyola Mast, MD

## 2020-12-14 ENCOUNTER — Other Ambulatory Visit: Payer: Self-pay | Admitting: Family

## 2020-12-14 DIAGNOSIS — E039 Hypothyroidism, unspecified: Secondary | ICD-10-CM

## 2020-12-21 DIAGNOSIS — H401121 Primary open-angle glaucoma, left eye, mild stage: Secondary | ICD-10-CM | POA: Diagnosis not present

## 2020-12-21 DIAGNOSIS — H401113 Primary open-angle glaucoma, right eye, severe stage: Secondary | ICD-10-CM | POA: Diagnosis not present

## 2020-12-30 ENCOUNTER — Telehealth: Payer: Self-pay

## 2020-12-30 NOTE — Telephone Encounter (Signed)
Spoke to patient regarding scheduling AWV and he declined to schedule an appointment. Dm/cma

## 2021-10-18 ENCOUNTER — Ambulatory Visit (INDEPENDENT_AMBULATORY_CARE_PROVIDER_SITE_OTHER): Payer: Medicare PPO

## 2021-10-18 DIAGNOSIS — Z0000 Encounter for general adult medical examination without abnormal findings: Secondary | ICD-10-CM | POA: Diagnosis not present

## 2021-10-18 NOTE — Progress Notes (Cosign Needed)
Subjective:   Todd Mckay is a 88 y.o. male who presents for an Initial Medicare Annual Wellness Visit.   I connected with Todd Mckay  today by telephone and verified that I am speaking with the correct person using two identifiers. Location patient: home Location provider: work Persons participating in the virtual visit: patient, provider.   I discussed the limitations, risks, security and privacy concerns of performing an evaluation and management service by telephone and the availability of in person appointments. I also discussed with the patient that there may be a patient responsible charge related to this service. The patient expressed understanding and verbally consented to this telephonic visit.    Interactive audio and video telecommunications were attempted between this provider and patient, however failed, due to patient having technical difficulties OR patient did not have access to video capability.  We continued and completed visit with audio only.    Review of Systems     Cardiac Risk Factors include: advanced age (>20men, >47 women);male gender     Objective:    Today's Vitals   There is no height or weight on file to calculate BMI.     10/18/2021   10:47 AM  Advanced Directives  Does Patient Have a Medical Advance Directive? Yes  Type of Paramedic of [place];Living will  Copy of [place] in Chart? No - copy requested    Current Medications (verified) Outpatient Encounter Medications as of 10/18/2021  Medication Sig   dorzolamide-timolol (COSOPT) 22.3-6.8 MG/ML ophthalmic solution Instill 1 drop in both eyes twice daily.   latanoprost (XALATAN) 0.005 % ophthalmic solution INT 1 GTT IN OD Q NIGHT   levothyroxine (SYNTHROID) 75 MCG tablet TAKE 1 TABLET(75 MCG) BY MOUTH DAILY   metoprolol tartrate (LOPRESSOR) 25 MG tablet Take by mouth.   pilocarpine (PILOCAR) 1 % ophthalmic solution INT 1 GTT IN OD TID    diltiazem (CARDIZEM CD) 180 MG 24 hr capsule Take 1 capsule by mouth daily. (Patient not taking: Reported on 10/18/2021)   donepezil (ARICEPT) 10 MG tablet Take 1 tablet by mouth daily. (Patient not taking: Reported on 10/18/2021)   omeprazole (PRILOSEC) 20 MG capsule TAKE 1 CAPSULE(20 MG) BY MOUTH DAILY (Patient not taking: Reported on 10/18/2021)   traZODone (DESYREL) 50 MG tablet Take 1/2-1qhs prn sleep (must have OV for future fills) (Patient not taking: Reported on 10/18/2021)   No facility-administered encounter medications on file as of 10/18/2021.    Allergies (verified) Acyclovir and related, Erythromycin, Erythromycin base, and Penicillins   History: Past Medical History:  Diagnosis Date   GERD (gastroesophageal reflux disease)    Glaucoma    Thyroid disease    Past Surgical History:  Procedure Laterality Date   CATARACT EXTRACTION Right    CATARACT EXTRACTION Left    History reviewed. No pertinent family history. Social History   Socioeconomic History   Marital status: Married    Spouse name: Not on file   Number of children: Not on file   Years of education: Not on file   Highest education level: Not on file  Occupational History   Not on file  Tobacco Use   Smoking status: Former   Smokeless tobacco: Never  Substance and Sexual Activity   Alcohol use: Yes    Alcohol/week: 19.0 standard drinks of alcohol    Types: 12 Glasses of wine, 7 Shots of liquor per week    Comment: One glass of wine with lunch and dinner. Vodka  martina in the late afternoon.   Drug use: Not on file   Sexual activity: Not on file  Other Topics Concern   Not on file  Social History Narrative   Not on file   Social Determinants of Health   Financial Resource Strain: Low Risk  (10/18/2021)   Overall Financial Resource Strain (CARDIA)    Difficulty of Paying Living Expenses: Not hard at all  Food Insecurity: No Food Insecurity (10/18/2021)   Hunger Vital Sign    Worried About Running Out  of Food in the Last Year: Never true    Ran Out of Food in the Last Year: Never true  Transportation Needs: No Transportation Needs (10/18/2021)   PRAPARE - Administrator, Civil Service (Medical): No    Lack of Transportation (Non-Medical): No  Physical Activity: Inactive (10/18/2021)   Exercise Vital Sign    Days of Exercise per Week: 0 days    Minutes of Exercise per Session: 0 min  Stress: No Stress Concern Present (10/18/2021)   [employer] of Occupational Health - Occupational Stress Questionnaire    Feeling of Stress : Not at all  Social Connections: Socially Integrated (10/18/2021)   Social Connection and Isolation Panel [NHANES]    Frequency of Communication with Friends and Family: Three times a week    Frequency of Social Gatherings with Friends and Family: Three times a week    Attends Religious Services: 1 to 4 times per year    Active Member of Clubs or Organizations: Yes    Attends Banker Meetings: 1 to 4 times per year    Marital Status: Married    Tobacco Counseling Counseling given: Not Answered   Clinical Intake:  Pre-visit preparation completed: Yes  Pain : No/denies pain     Nutritional Risks: None Diabetes: No  How often do you need to have someone help you when you read instructions, pamphlets, or other written materials from your doctor or pharmacy?: 1 - Never What is the last grade level you completed in school?: DDS  Diabetic?no   Interpreter Needed?: No  Information entered by :: L.Andrika Peraza,LPn   Activities of Daily Living    10/18/2021   10:50 AM  In your present state of health, do you have any difficulty performing the following activities:  Hearing? 0  Vision? 0  Difficulty concentrating or making decisions? 0  Walking or climbing stairs? 0  Dressing or bathing? 0  Doing errands, shopping? 0  Preparing Food and eating ? N  Using the Toilet? N  In the past six months, have you accidently leaked urine? N   Do you have problems with loss of bowel control? N  Managing your Medications? N  Managing your Finances? N  Housekeeping or managing your Housekeeping? N    Patient Care Team: Mliss Sax, MD as PCP - General (Family Medicine)  Indicate any recent Medical Services you may have received from other than [facility] providers in the past year (date may be approximate).     Assessment:   This is a routine wellness examination for Marisa.  Hearing/Vision screen Vision Screening - Comments:: Annual eye exam wear glasses   Dietary issues and exercise activities discussed: Current Exercise Habits: The patient does not participate in regular exercise at present   Goals Addressed   None    Depression Screen    10/18/2021   10:48 AM 10/18/2021   10:47 AM 10/05/2020    3:09 PM 12/24/2017  2:35 PM  PHQ 2/9 Scores  PHQ - 2 Score 0 0 0 1  PHQ- 9 Score    6    Fall Risk    10/18/2021   10:48 AM  Fall Risk   Falls in the past year? 0  Number falls in past yr: 0  Injury with Fall? 0  Risk for fall due to : No Fall Risks  Follow up Falls evaluation completed;Education provided  Comment walker    FALL RISK PREVENTION PERTAINING TO THE HOME:  Any stairs in or around the home? Yes  If so, are there any without handrails? No  Home free of loose throw rugs in walkways, pet beds, electrical cords, etc? Yes  Adequate lighting in your home to reduce risk of falls? Yes   ASSISTIVE DEVICES UTILIZED TO PREVENT FALLS:  Life alert? Yes  Use of a cane, walker or w/c? Yes  Grab bars in the bathroom? Yes  Shower chair or bench in shower? Yes  Elevated toilet seat or a handicapped toilet? Yes     Cognitive Function:    Normal cognitive status assessed by telephone conversation by this Nurse Health Advisor. No abnormalities found.      Immunizations Immunization History  Administered Date(s) Administered   Influenza Split 12/30/2010, 01/15/2012, 01/23/2013   Influenza, High  Dose Seasonal PF 02/02/2015   Influenza-Unspecified 01/19/2014, 02/03/2016   PFIZER Comirnaty(Gray Top)Covid-19 Tri-Sucrose Vaccine 09/05/2020   PFIZER(Purple Top)SARS-COV-2 Vaccination 07/31/2019, 08/24/2019   Pfizer Covid-19 Vaccine Bivalent Booster 39yrs & up 06/14/2021   Pneumococcal Conjugate-13 07/24/2012   Pneumococcal Polysaccharide-23 10/10/2015    TDAP status: Up to date  Flu Vaccine status: Up to date  Pneumococcal vaccine status: Up to date  Covid-19 vaccine status: Completed vaccines  Qualifies for Shingles Vaccine? Yes   Zostavax completed No   Shingrix Completed?: No.    Education has been provided regarding the importance of this vaccine. Patient has been advised to call insurance company to determine out of pocket expense if they have not yet received this vaccine. Advised may also receive vaccine at local pharmacy or Health Dept. Verbalized acceptance and understanding.  Screening Tests Health Maintenance  Topic Date Due   TETANUS/TDAP  Never done   Zoster Vaccines- Shingrix (1 of 2) Never done   INFLUENZA VACCINE  11/28/2021   Pneumonia Vaccine 64+ Years old  Completed   COVID-19 Vaccine  Completed   HPV VACCINES  Aged Out    Health Maintenance  Health Maintenance Due  Topic Date Due   TETANUS/TDAP  Never done   Zoster Vaccines- Shingrix (1 of 2) Never done    Colorectal cancer screening: No longer required.   Lung Cancer Screening: (Low Dose CT Chest recommended if Age 23-80 years, 30 pack-year currently smoking OR have quit w/in 15years.) does not qualify.   Lung Cancer Screening Referral: n/a  Additional Screening:  Hepatitis C Screening: does not qualify;   Vision Screening: Recommended annual ophthalmology exams for early detection of glaucoma and other disorders of the eye. Is the patient up to date with their annual eye exam?  Yes  Who is the provider or what is the name of the office in which the patient attends annual eye exams? Dr  Lewie Chamber  If pt is not established with a provider, would they like to be referred to a provider to establish care? No .   Dental Screening: Recommended annual dental exams for proper oral hygiene  Community Resource Referral / Chronic Care Management: CRR  required this visit?  No   CCM required this visit?  No      Plan:     I have personally reviewed and noted the following in the patient's chart:   Medical and social history Use of alcohol, tobacco or illicit drugs  Current medications and supplements including opioid prescriptions. Patient is not currently taking opioid prescriptions. Functional ability and status Nutritional status Physical activity Advanced directives List of other physicians Hospitalizations, surgeries, and ER visits in previous 12 months Vitals Screenings to include cognitive, depression, and falls Referrals and appointments  In addition, I have reviewed and discussed with patient certain preventive protocols, quality metrics, and best practice recommendations. A written personalized care plan for preventive services as well as general preventive health recommendations were provided to patient.     Randel Pigg, LPN   [id number]   Nurse Notes: none

## 2021-10-18 NOTE — Patient Instructions (Signed)
Todd Mckay , Thank you for taking time to come for your Medicare Wellness Visit. I appreciate your ongoing commitment to your health goals. Please review the following plan we discussed and let me know if I can assist you in the future.   Screening recommendations/referrals: Colonoscopy: no longer required  Recommended yearly ophthalmology/optometry visit for glaucoma screening and checkup Recommended yearly dental visit for hygiene and checkup  Vaccinations: Influenza vaccine: completed  Pneumococcal vaccine: completed  Tdap vaccine: due  Shingles vaccine: declined     Advanced directives: yes   Conditions/risks identified: none   Next appointment: none   Preventive Care 65 Years and Older, Male Preventive care refers to lifestyle choices and visits with your health care provider that can promote health and wellness. What does preventive care include? A yearly physical exam. This is also called an annual well check. Dental exams once or twice a year. Routine eye exams. Ask your health care provider how often you should have your eyes checked. Personal lifestyle choices, including: Daily care of your teeth and gums. Regular physical activity. Eating a healthy diet. Avoiding tobacco and drug use. Limiting alcohol use. Practicing safe sex. Taking low doses of aspirin every day. Taking vitamin and mineral supplements as recommended by your health care provider. What happens during an annual well check? The services and screenings done by your health care provider during your annual well check will depend on your age, overall health, lifestyle risk factors, and family history of disease. Counseling  Your health care provider may ask you questions about your: Alcohol use. Tobacco use. Drug use. Emotional well-being. Home and relationship well-being. Sexual activity. Eating habits. History of falls. Memory and ability to understand (cognition). Work and work  Astronomer. Screening  You may have the following tests or measurements: Height, weight, and BMI. Blood pressure. Lipid and cholesterol levels. These may be checked every 5 years, or more frequently if you are over 49 years old. Skin check. Lung cancer screening. You may have this screening every year starting at age 70 if you have a 30-pack-year history of smoking and currently smoke or have quit within the past 15 years. Fecal occult blood test (FOBT) of the stool. You may have this test every year starting at age 45. Flexible sigmoidoscopy or colonoscopy. You may have a sigmoidoscopy every 5 years or a colonoscopy every 10 years starting at age 82. Prostate cancer screening. Recommendations will vary depending on your family history and other risks. Hepatitis C blood test. Hepatitis B blood test. Sexually transmitted disease (STD) testing. Diabetes screening. This is done by checking your blood sugar (glucose) after you have not eaten for a while (fasting). You may have this done every 1-3 years. Abdominal aortic aneurysm (AAA) screening. You may need this if you are a current or former smoker. Osteoporosis. You may be screened starting at age 28 if you are at high risk. Talk with your health care provider about your test results, treatment options, and if necessary, the need for more tests. Vaccines  Your health care provider may recommend certain vaccines, such as: Influenza vaccine. This is recommended every year. Tetanus, diphtheria, and acellular pertussis (Tdap, Td) vaccine. You may need a Td booster every 10 years. Zoster vaccine. You may need this after age 55. Pneumococcal 13-valent conjugate (PCV13) vaccine. One dose is recommended after age 89. Pneumococcal polysaccharide (PPSV23) vaccine. One dose is recommended after age 8. Talk to your health care provider about which screenings and vaccines you need and  how often you need them. This information is not intended to replace  advice given to you by your health care provider. Make sure you discuss any questions you have with your health care provider. Document Released: 05/13/2015 Document Revised: 01/04/2016 Document Reviewed: 02/15/2015 Elsevier Interactive Patient Education  2017 [employer] Prevention in the Home Falls can cause injuries. They can happen to people of all ages. There are many things you can do to make your home safe and to help prevent falls. What can I do on the outside of my home? Regularly fix the edges of walkways and driveways and fix any cracks. Remove anything that might make you trip as you walk through a door, such as a raised step or threshold. Trim any bushes or trees on the path to your home. Use bright outdoor lighting. Clear any walking paths of anything that might make someone trip, such as rocks or tools. Regularly check to see if handrails are loose or broken. Make sure that both sides of any steps have handrails. Any raised decks and porches should have guardrails on the edges. Have any leaves, snow, or ice cleared regularly. Use sand or salt on walking paths during winter. Clean up any spills in your garage right away. This includes oil or grease spills. What can I do in the bathroom? Use night lights. Install grab bars by the toilet and in the tub and shower. Do not use towel bars as grab bars. Use non-skid mats or decals in the tub or shower. If you need to sit down in the shower, use a plastic, non-slip stool. Keep the floor dry. Clean up any water that spills on the floor as soon as it happens. Remove soap buildup in the tub or shower regularly. Attach bath mats securely with double-sided non-slip rug tape. Do not have throw rugs and other things on the floor that can make you trip. What can I do in the bedroom? Use night lights. Make sure that you have a light by your bed that is easy to reach. Do not use any sheets or blankets that are too big for your bed.  They should not hang down onto the floor. Have a firm chair that has side arms. You can use this for support while you get dressed. Do not have throw rugs and other things on the floor that can make you trip. What can I do in the kitchen? Clean up any spills right away. Avoid walking on wet floors. Keep items that you use a lot in easy-to-reach places. If you need to reach something above you, use a strong step stool that has a grab bar. Keep electrical cords out of the way. Do not use floor polish or wax that makes floors slippery. If you must use wax, use non-skid floor wax. Do not have throw rugs and other things on the floor that can make you trip. What can I do with my stairs? Do not leave any items on the stairs. Make sure that there are handrails on both sides of the stairs and use them. Fix handrails that are broken or loose. Make sure that handrails are as long as the stairways. Check any carpeting to make sure that it is firmly attached to the stairs. Fix any carpet that is loose or worn. Avoid having throw rugs at the top or bottom of the stairs. If you do have throw rugs, attach them to the floor with carpet tape. Make sure that you have  a light switch at the top of the stairs and the bottom of the stairs. If you do not have them, ask someone to add them for you. What else can I do to help prevent falls? Wear shoes that: Do not have high heels. Have rubber bottoms. Are comfortable and fit you well. Are closed at the toe. Do not wear sandals. If you use a stepladder: Make sure that it is fully opened. Do not climb a closed stepladder. Make sure that both sides of the stepladder are locked into place. Ask someone to hold it for you, if possible. Clearly mark and make sure that you can see: Any grab bars or handrails. First and last steps. Where the edge of each step is. Use tools that help you move around (mobility aids) if they are needed. These  include: Canes. Walkers. Scooters. Crutches. Turn on the lights when you go into a dark area. Replace any light bulbs as soon as they burn out. Set up your furniture so you have a clear path. Avoid moving your furniture around. If any of your floors are uneven, fix them. If there are any pets around you, be aware of where they are. Review your medicines with your doctor. Some medicines can make you feel dizzy. This can increase your chance of falling. Ask your doctor what other things that you can do to help prevent falls. This information is not intended to replace advice given to you by your health care provider. Make sure you discuss any questions you have with your health care provider. Document Released: 02/10/2009 Document Revised: 09/22/2015 Document Reviewed: 05/21/2014 Elsevier Interactive Patient Education  2017 [employer].

## 2022-12-07 ENCOUNTER — Telehealth: Payer: Self-pay | Admitting: Family Medicine

## 2022-12-07 NOTE — Telephone Encounter (Signed)
Patient has not been seen by current PCP in a year. Reached out to the patient to see if they have a new PCP or if they would like to continue care with the provider.  Spoke with [facility], pt is sleeping will. Will give a callback

## 2023-05-31 ENCOUNTER — Telehealth: Payer: Self-pay

## 2023-05-31 NOTE — Transitions of Care (Post Inpatient/ED Visit) (Unsigned)
   05/31/2023  Name: Todd Mckay MRN: [id number] DOB: [birth year]  Today's TOC FU Call Status: Today's TOC FU Call Status:: Unsuccessful Call (1st Attempt) Unsuccessful Call (1st Attempt) Date: 05/31/23  Attempted to reach the patient regarding the most recent Inpatient/ED visit.  Follow Up Plan: Additional outreach attempts will be made to reach the patient to complete the Transitions of Care (Post Inpatient/ED visit) call.   Signature Arvil Persons, BSN, Charity fundraiser

## 2023-06-03 NOTE — Transitions of Care (Post Inpatient/ED Visit) (Unsigned)
   06/03/2023  Name: Todd Mckay MRN: [id number] DOB: 12-27-1927  Today's TOC FU Call Status: Today's TOC FU Call Status:: Unsuccessful Call (2nd Attempt) Unsuccessful Call (1st Attempt) Date: 05/31/23 Unsuccessful Call (2nd Attempt) Date: 06/03/23  Attempted to reach the patient regarding the most recent Inpatient/ED visit.  Follow Up Plan: Additional outreach attempts will be made to reach the patient to complete the Transitions of Care (Post Inpatient/ED visit) call.   Signature Arvil Persons, BSN, Charity fundraiser

## 2023-06-05 NOTE — Transitions of Care (Post Inpatient/ED Visit) (Signed)
   06/05/2023  Name: Todd Mckay MRN: [id number] DOB: August 19, 1927  Today's TOC FU Call Status: Today's TOC FU Call Status:: Unsuccessful Call (3rd Attempt) Unsuccessful Call (1st Attempt) Date: 05/31/23 Unsuccessful Call (2nd Attempt) Date: 06/03/23 Unsuccessful Call (3rd Attempt) Date: 06/05/23  Attempted to reach the patient regarding the most recent Inpatient/ED visit.  Follow Up Plan: No further outreach attempts will be made at this time. We have been unable to contact the patient.  Signature Dedra Sorenson, BSN, CHARITY FUNDRAISER
